# Patient Record
Sex: Male | Born: 1960 | Race: White | Hispanic: No | Marital: Married | Smoking: Never smoker
Health system: Southern US, Community
[De-identification: ages and names within clinical notes are randomized; demographics above are authoritative.]

## PROBLEM LIST (undated history)

## (undated) DIAGNOSIS — M1990 Unspecified osteoarthritis, unspecified site: Secondary | ICD-10-CM

## (undated) DIAGNOSIS — R7303 Prediabetes: Secondary | ICD-10-CM

## (undated) DIAGNOSIS — G4730 Sleep apnea, unspecified: Secondary | ICD-10-CM

## (undated) DIAGNOSIS — I10 Essential (primary) hypertension: Secondary | ICD-10-CM

## (undated) DIAGNOSIS — J45909 Unspecified asthma, uncomplicated: Secondary | ICD-10-CM

## (undated) DIAGNOSIS — T7840XA Allergy, unspecified, initial encounter: Secondary | ICD-10-CM

## (undated) DIAGNOSIS — E119 Type 2 diabetes mellitus without complications: Secondary | ICD-10-CM

## (undated) DIAGNOSIS — K219 Gastro-esophageal reflux disease without esophagitis: Secondary | ICD-10-CM

## (undated) DIAGNOSIS — I2510 Atherosclerotic heart disease of native coronary artery without angina pectoris: Secondary | ICD-10-CM

## (undated) HISTORY — DX: Sleep apnea, unspecified: G47.30

## (undated) HISTORY — DX: Allergy, unspecified, initial encounter: T78.40XA

## (undated) HISTORY — PX: CORONARY ARTERY BYPASS GRAFT: SHX141

## (undated) HISTORY — DX: Unspecified osteoarthritis, unspecified site: M19.90

## (undated) HISTORY — DX: Gastro-esophageal reflux disease without esophagitis: K21.9

## (undated) HISTORY — PX: JOINT REPLACEMENT: SHX530

## (undated) HISTORY — PX: PALATE / UVULA BIOPSY / EXCISION: SUR128

## (undated) HISTORY — PX: OTHER SURGICAL HISTORY: SHX169

## (undated) HISTORY — PX: MENISCUS REPAIR: SHX5179

## (undated) HISTORY — PX: CARDIAC SURGERY: SHX584

---

## 2012-10-05 LAB — HM COLONOSCOPY

## 2020-09-08 DIAGNOSIS — I2510 Atherosclerotic heart disease of native coronary artery without angina pectoris: Secondary | ICD-10-CM | POA: Insufficient documentation

## 2020-09-08 DIAGNOSIS — E782 Mixed hyperlipidemia: Secondary | ICD-10-CM | POA: Insufficient documentation

## 2020-09-08 DIAGNOSIS — I10 Essential (primary) hypertension: Secondary | ICD-10-CM | POA: Insufficient documentation

## 2020-09-08 DIAGNOSIS — G4733 Obstructive sleep apnea (adult) (pediatric): Secondary | ICD-10-CM | POA: Insufficient documentation

## 2020-09-08 DIAGNOSIS — Z951 Presence of aortocoronary bypass graft: Secondary | ICD-10-CM | POA: Insufficient documentation

## 2020-09-08 DIAGNOSIS — I351 Nonrheumatic aortic (valve) insufficiency: Secondary | ICD-10-CM | POA: Insufficient documentation

## 2021-06-27 HISTORY — PX: MENISCUS REPAIR: SHX5179

## 2021-11-23 ENCOUNTER — Other Ambulatory Visit: Payer: Self-pay | Admitting: Nurse Practitioner

## 2021-11-23 ENCOUNTER — Ambulatory Visit: Payer: Self-pay

## 2021-11-23 DIAGNOSIS — M25561 Pain in right knee: Secondary | ICD-10-CM

## 2021-12-15 DIAGNOSIS — S83241A Other tear of medial meniscus, current injury, right knee, initial encounter: Secondary | ICD-10-CM | POA: Insufficient documentation

## 2022-02-10 ENCOUNTER — Ambulatory Visit
Admission: EM | Admit: 2022-02-10 | Discharge: 2022-02-10 | Disposition: A | Discharge status: Home / Self Care | Payer: BC Managed Care – PPO

## 2022-02-10 DIAGNOSIS — L239 Allergic contact dermatitis, unspecified cause: Secondary | ICD-10-CM

## 2022-02-10 HISTORY — DX: Essential (primary) hypertension: I10

## 2022-02-10 HISTORY — DX: Unspecified asthma, uncomplicated: J45.909

## 2022-02-10 HISTORY — DX: Type 2 diabetes mellitus without complications: E11.9

## 2022-02-10 MED ORDER — PREDNISONE 10 MG (21) PO TBPK: ORAL_TABLET | Freq: Every day | ORAL | 0 refills | Status: DC

## 2022-02-10 MED ORDER — DEXAMETHASONE SODIUM PHOSPHATE 10 MG/ML IJ SOLN
10.0000 mg | Freq: Once | INTRAMUSCULAR | Status: AC
  Administered 2022-02-10: 10 mg via INTRAMUSCULAR

## 2022-02-10 NOTE — ED Triage Notes (Signed)
Patient to urgent care with complaints of rash x1 week over his body (not present to face or torso). Describes it as itchy. Denies use of any new products/ lotions/ detergents. Denies SHOB.  Reports the only symptom he had prior to the rash onset was a cut on his finger but this has now healed.

## 2022-02-10 NOTE — Discharge Instructions (Addendum)
You were given an injection of a steroid.  Start the prednisone taper tomorrow as directed.    Take Benadryl or Zyrtec as directed.    Follow up with your primary care provider.

## 2022-02-10 NOTE — ED Provider Notes (Addendum)
[facility]    CSN: [id number] Arrival date & time: 02/10/22  1102      History   Chief Complaint Chief Complaint  Patient presents with   Rash    HPI Geoffrey Orozco is a 61 y.o. male.   Patient presents with 2-week history of pruritic rash on his trunk and extremities.  Treatment attempted with Benadryl.  No new products, medications, foods.  He denies fever, chills, sore throat, cough, shortness of breath, or other symptoms.  His medical history includes hypertension, CAD, CABG, aortic regurgitation, hyperlipidemia, OSA, asthma, diabetes.  The history is provided by the patient and medical records.    Past Medical History:  Diagnosis Date   Asthma    Diabetes mellitus without complication (HCC)    Hypertension     There are no problems to display for this patient.   Past Surgical History:  Procedure Laterality Date   bypass     CARDIAC SURGERY     JOINT REPLACEMENT     PALATE / UVULA BIOPSY / EXCISION         Home Medications    Prior to Admission medications   Medication Sig Start Date End Date Taking? Authorizing Provider  doxazosin (CARDURA) 1 MG tablet Take 1 tablet by mouth at bedtime. 09/08/20  Yes [provider]  lisinopril (ZESTRIL) 30 MG tablet Take by mouth. 03/29/21  Yes [provider]  predniSONE (STERAPRED UNI-PAK 21 TAB) 10 MG (21) TBPK tablet Take by mouth daily. As directed 02/11/22  Yes Mickie Bail, NP  aspirin EC 81 MG tablet Take by mouth.    [provider]  Cholecalciferol 25 MCG (1000 UT) tablet Take by mouth.    [provider]  cyanocobalamin ([facility] VITAMIN B12) 1000 MCG tablet Take by mouth.    [provider]  FLUTICASONE PROPIONATE, INHAL, IN Inhale into the lungs.    [provider]  PRALUENT 75 MG/ML SOAJ Inject 1 mL into the skin every 14 (fourteen) days. 12/07/21   [provider]  Saw Palmetto, Serenoa repens, 450 MG CAPS Take by mouth.    [provider]    Family History History reviewed. No pertinent family history.  Social History Social History   Tobacco Use   Smoking status: Never   Smokeless tobacco: Never  Substance Use Topics   Alcohol use: Yes   Drug use: Never     Allergies   Nitroglycerin   Review of Systems Review of Systems  Constitutional:  Negative for chills and fever.  HENT:  Negative for ear pain and sore throat.   Respiratory:  Negative for cough and shortness of breath.   Gastrointestinal:  Negative for diarrhea and vomiting.  Skin:  Positive for rash. Negative for color change.  All other systems reviewed and are negative.    Physical Exam Triage Vital Signs ED Triage Vitals [02/10/22 1117]  Enc Vitals Group     BP 138/81     Pulse Rate 78     Resp 18     Temp 98.2 F (36.8 C)     Temp src      SpO2 97 %     Weight      Height      Head Circumference      Peak Flow      Pain Score      Pain Loc      Pain Edu?      Excl. in GC?  No data found.  Updated Vital Signs BP 138/81   Pulse 78   Temp 98.2 F (36.8 C)   Resp 18   Ht 5\' 10"  (1.778 m)   Wt 225 lb (102.1 kg)   SpO2 97%   BMI 32.28 kg/m   Visual Acuity Right Eye Distance:   Left Eye Distance:   Bilateral Distance:    Right Eye Near:   Left Eye Near:    Bilateral Near:     Physical Exam Vitals and nursing note reviewed.  Constitutional:      General: He is not in acute distress.    Appearance: Normal appearance. He is well-developed. He is not ill-appearing.  HENT:     Mouth/Throat:     Mouth: Mucous membranes are moist.  Cardiovascular:     Rate and Rhythm: Normal rate and regular rhythm.     Heart sounds: Normal heart sounds.  Pulmonary:     Effort: Pulmonary effort is normal. No respiratory distress.     Breath sounds: Normal breath sounds.  Musculoskeletal:     Cervical back: Neck supple.  Skin:    General: Skin is warm and dry.     Findings: Rash present.     Comments: Diffuse  scattered maculopapular rash on trunk and extremities.  No open wounds or drainage.  Neurological:     Mental Status: He is alert.  Psychiatric:        Mood and Affect: Mood normal.        Behavior: Behavior normal.      UC Treatments / Results  Labs (all labs ordered are listed, but only abnormal results are displayed) Labs Reviewed - No data to display  EKG   Radiology No results found.  Procedures Procedures (including critical care time)  Medications Ordered in UC Medications  dexamethasone (DECADRON) injection 10 mg (10 mg Intramuscular Given 02/10/22 1151)    Initial Impression / Assessment and Plan / UC Course  I have reviewed the triage vital signs and the nursing notes.  Pertinent labs & imaging results that were available during my care of the patient were reviewed by me and considered in my medical decision making (see chart for details).    Allergic dermatitis.  Dexamethasone injection given here 02/12/22 on Solu-Medrol).  Starting prednisone taper tomorrow.  Also discussed Zyrtec or Benadryl daily.  Education provided on contact dermatitis.  Instructed patient to follow-up with a PCP if his symptoms are not improving.  Patient does not currently have a PCP but our RN scheduled an appointment for him to establish with PCP at [facility].  Patient agrees to plan of care.  Final Clinical Impressions(s) / UC Diagnoses   Final diagnoses:  Allergic dermatitis     Discharge Instructions      You were given an injection of a steroid.  Start the prednisone taper tomorrow as directed.    Take Benadryl or Zyrtec as directed.    Follow up with your primary care provider.        ED Prescriptions     Medication Sig Dispense Auth. Provider   predniSONE (STERAPRED UNI-PAK 21 TAB) 10 MG (21) TBPK tablet Take by mouth daily. As directed 21 tablet [facility], NP      PDMP not reviewed this encounter.   Mickie Bail, NP 02/10/22  1156    02/12/22, NP 02/10/22 1159

## 2022-03-24 ENCOUNTER — Ambulatory Visit (INDEPENDENT_AMBULATORY_CARE_PROVIDER_SITE_OTHER): Payer: BC Managed Care – PPO | Admitting: Nurse Practitioner

## 2022-03-24 ENCOUNTER — Encounter: Payer: Self-pay | Admitting: Nurse Practitioner

## 2022-03-24 VITALS — BP 136/82 | HR 76 | Temp 97.1°F | Resp 10 | Ht 70.0 in | Wt 234.1 lb

## 2022-03-24 DIAGNOSIS — E6609 Other obesity due to excess calories: Secondary | ICD-10-CM | POA: Insufficient documentation

## 2022-03-24 DIAGNOSIS — Z951 Presence of aortocoronary bypass graft: Secondary | ICD-10-CM

## 2022-03-24 DIAGNOSIS — Z6833 Body mass index (BMI) 33.0-33.9, adult: Secondary | ICD-10-CM

## 2022-03-24 DIAGNOSIS — G4733 Obstructive sleep apnea (adult) (pediatric): Secondary | ICD-10-CM | POA: Diagnosis not present

## 2022-03-24 DIAGNOSIS — I10 Essential (primary) hypertension: Secondary | ICD-10-CM | POA: Diagnosis not present

## 2022-03-24 DIAGNOSIS — R140 Abdominal distension (gaseous): Secondary | ICD-10-CM | POA: Insufficient documentation

## 2022-03-24 DIAGNOSIS — Z1211 Encounter for screening for malignant neoplasm of colon: Secondary | ICD-10-CM

## 2022-03-24 DIAGNOSIS — I2510 Atherosclerotic heart disease of native coronary artery without angina pectoris: Secondary | ICD-10-CM

## 2022-03-24 DIAGNOSIS — E782 Mixed hyperlipidemia: Secondary | ICD-10-CM

## 2022-03-24 LAB — CBC
HCT: 41.6 % (ref 39.0–52.0)
Hemoglobin: 14.1 g/dL (ref 13.0–17.0)
MCHC: 34 g/dL (ref 30.0–36.0)
MCV: 88.3 fl (ref 78.0–100.0)
Platelets: 257 10*3/uL (ref 150.0–400.0)
RBC: 4.71 Mil/uL (ref 4.22–5.81)
RDW: 14.2 % (ref 11.5–15.5)
WBC: 5.3 10*3/uL (ref 4.0–10.5)

## 2022-03-24 LAB — COMPREHENSIVE METABOLIC PANEL
ALT: 50 U/L (ref 0–53)
AST: 22 U/L (ref 0–37)
Albumin: 4.7 g/dL (ref 3.5–5.2)
Alkaline Phosphatase: 58 U/L (ref 39–117)
BUN: 21 mg/dL (ref 6–23)
CO2: 28 mEq/L (ref 19–32)
Calcium: 10 mg/dL (ref 8.4–10.5)
Chloride: 105 mEq/L (ref 96–112)
Creatinine, Ser: 0.98 mg/dL (ref 0.40–1.50)
GFR: 82.76 mL/min (ref >60.00)
Glucose, Bld: 95 mg/dL (ref 70–99)
Potassium: 4.2 mEq/L (ref 3.5–5.1)
Sodium: 139 mEq/L (ref 135–145)
Total Bilirubin: 0.5 mg/dL (ref 0.2–1.2)
Total Protein: 7.3 g/dL (ref 6.0–8.3)

## 2022-03-24 LAB — HEMOGLOBIN A1C: Hgb A1c MFr Bld: 5.9 % (ref 4.6–6.5)

## 2022-03-24 LAB — H. PYLORI ANTIBODY, IGG: H Pylori IgG: NEGATIVE

## 2022-03-24 LAB — LIPASE: Lipase: 53 U/L (ref 11.0–59.0)

## 2022-03-24 MED ORDER — OMEPRAZOLE 20 MG PO CPDR: 20.0000 mg | DELAYED_RELEASE_CAPSULE | Freq: Every day | ORAL | 2 refills | Status: DC

## 2022-03-24 NOTE — Assessment & Plan Note (Signed)
History of CABG and then 2 stents post CABG per patient report.

## 2022-03-24 NOTE — Assessment & Plan Note (Signed)
Currently followed by Dr. Jac Canavan on doxazosin and lisinopril.

## 2022-03-24 NOTE — Assessment & Plan Note (Signed)
Check labs inclusive of TSH and A1c.

## 2022-03-24 NOTE — Assessment & Plan Note (Signed)
We will start patient on omeprazole 20 mg daily.  We will do for 1 to 3 months depending on response.  If no response consider probiotic.  Pending labs inclusive of lipase and H. pylori

## 2022-03-24 NOTE — Progress Notes (Signed)
New Patient Office Visit  Subjective    Patient ID: Geoffrey Orozco, male    DOB: 1960-09-10  Age: 61 y.o. MRN: [id number]  CC:  Chief Complaint  Patient presents with   Establish Care    Previous PCP in [place] about 5 years ago Dr. Langley Gauss L. Autotte Kays   abdominal bloating/distention    Noticed about 2 months ago his stomach staying hard and bloated.    HPI Geoffrey Orozco presents to establish care   HTN: Dr Jac Canavan  manages all the meds. Checks it montly at home.  Currently maintained on doxazosin lisinopril.  HLD: Repatha for HLD. States that it has been on it for years. Has trailed several statins with all having adverse effects  OSA: Had surgery and no longer has troube. No CPAP  Abdomen: states that he stopped working out because of his knee. States that since then he has gained weight and feels bloated even with a glass of water. Early satietiy. Bm daily at least twice a day, no rock hard stools or pellets. Last colonoscopy 12 years ago. Has tried tums when it is bad and helps some.  TD 2018 Flu vaccine yesterday, [facility] Moderna: Shingles up-to-date per patient report [facility]    Outpatient Encounter Medications as of 03/24/2022  Medication Sig   aspirin EC 81 MG tablet Take by mouth.   Cholecalciferol 25 MCG (1000 UT) tablet Take by mouth.   cyanocobalamin ([facility] VITAMIN B12) 1000 MCG tablet Take by mouth.   doxazosin (CARDURA) 1 MG tablet Take 1 tablet by mouth at bedtime.   lisinopril (ZESTRIL) 30 MG tablet Take by mouth.   omeprazole (PRILOSEC) 20 MG capsule Take 1 capsule (20 mg total) by mouth daily.   REPATHA SURECLICK [id number] MG/ML SOAJ Inject 1 mL into the skin every 14 (fourteen) days.   Saw Palmetto, Serenoa repens, 450 MG CAPS Take by mouth.   [DISCONTINUED] FLUTICASONE PROPIONATE, INHAL, IN Inhale into the lungs.   [DISCONTINUED] PRALUENT 75 MG/ML SOAJ Inject 1 mL into the skin every 14 (fourteen) days.   [DISCONTINUED]  predniSONE (STERAPRED UNI-PAK 21 TAB) 10 MG (21) TBPK tablet Take by mouth daily. As directed   No facility-administered encounter medications on file as of 03/24/2022.    Past Medical History:  Diagnosis Date   Allergy 1971   Seasonal allergies. 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   Asthma    Diabetes mellitus without complication ([place])    Hypertension    Sleep apnea 1998    Past Surgical History:  Procedure Laterality Date   CORONARY ARTERY BYPASS GRAFT  2009   Quadruple bypass   MENISCUS REPAIR Right    got hurt on the job   PALATE / UVULA BIOPSY / EXCISION      Family History  Problem Relation Age of Onset   Arthritis Mother    Glaucoma Mother    Heart disease Father        pacemaker   Hearing loss Father    Glaucoma Maternal Grandmother    Heart attack Maternal Grandfather 85    Social History   Socioeconomic History   Marital status: Married    Spouse name: Mardene Celeste   Number of children: 3   Years of education: Some   Highest education level: Not on file  Occupational History   Not on file  Tobacco Use   Smoking status: Never   Smokeless tobacco: Never  Vaping  Use   Vaping Use: Never used  Substance and Sexual Activity   Alcohol use: Yes    Comment: Social   Drug use: Never   Sexual activity: Yes    Birth control/protection: None  Other Topics Concern   Not on file  Social History Narrative   Fulltime: Mr [employer]   Out since 11/19/2021      29 Danella Maiers 27, tyler 26   Social Determinants of Health   Financial Resource Strain: Not on [employer] Insecurity: Not on file  Transportation Needs: Not on file  Physical Activity: Not on file  Stress: Not on file  Social Connections: Not on file  Intimate Partner Violence: Not on file    Review of Systems  Constitutional:  Negative for chills and fever.  Respiratory:  Positive for shortness of breath (when bending over).   Cardiovascular:  Negative for chest pain.   Gastrointestinal:  Negative for abdominal pain, diarrhea, nausea and vomiting.        Objective    BP 136/82   Pulse 76   Temp (!) 97.1 F (36.2 C) (Temporal)   Resp 10   Ht 5\' 10"  (1.778 m)   Wt 234 lb 2 oz (106.2 kg)   SpO2 96%   BMI 33.59 kg/m   Physical Exam Vitals and nursing note reviewed.  Constitutional:      Appearance: Normal appearance. He is obese.  Cardiovascular:     Rate and Rhythm: Normal rate and regular rhythm.     Heart sounds: Normal heart sounds.  Pulmonary:     Effort: Pulmonary effort is normal.     Breath sounds: Normal breath sounds.  Abdominal:     General: Bowel sounds are normal. There is no distension.     Palpations: There is no mass.     Tenderness: There is no abdominal tenderness.     Hernia: No hernia is present.  Musculoskeletal:     Right lower leg: Edema (trace) present.     Left lower leg: Edema (trace) present.  Neurological:     Mental Status: He is alert.         Assessment & Plan:   Problem List Items Addressed This Visit       Cardiovascular and Mediastinum   Coronary artery disease involving native coronary artery of native heart without angina pectoris    Patient currently followed by Dr. Jac Canavan cardiology on [place].      Relevant Medications   REPATHA SURECLICK 027 MG/ML SOAJ   HTN (hypertension)    Currently followed by Dr. Jac Canavan on doxazosin and lisinopril.      Relevant Medications   REPATHA SURECLICK 253 MG/ML SOAJ   Other Relevant Orders   TSH     Respiratory   OSA (obstructive sleep apnea)    Patient underwent uvulectomy.  Not currently maintained on CPAP.        Other   Mixed hyperlipidemia    Followed by cardiology currently on Repatha.      Relevant Medications   REPATHA SURECLICK 664 MG/ML SOAJ   S/P CABG x 4    History of CABG and then 2 stents post CABG per patient report.      Abdominal bloating - Primary    We will start patient on omeprazole 20 mg daily.  We will do for  1 to 3 months depending on response.  If no response consider probiotic.  Pending labs inclusive of lipase and H. pylori  Relevant Medications   omeprazole (PRILOSEC) 20 MG capsule   Other Relevant Orders   CBC   Comprehensive metabolic panel   Lipase   H. pylori antibody, IgG   Class 1 obesity due to excess calories with serious comorbidity and body mass index (BMI) of 33.0 to 33.9 in adult    Check labs inclusive of TSH and A1c.      Relevant Orders   TSH   Hemoglobin A1c   Other Visit Diagnoses     Screening for colon cancer       Relevant Orders   Ambulatory referral to Gastroenterology       Return in about 6 months (around 09/22/2022) for CPE and Labs.   Romilda Garret, NP

## 2022-03-24 NOTE — Assessment & Plan Note (Signed)
Followed by cardiology currently on [place].

## 2022-03-24 NOTE — Assessment & Plan Note (Signed)
Patient underwent uvulectomy.  Not currently maintained on CPAP.

## 2022-03-24 NOTE — Patient Instructions (Signed)
Nice to see you today I will be in touch with the labs once I have them I sent the omeprazole to the pharmacy Follow up with me in aorund 6 months for your physical, sooner if you need me

## 2022-03-24 NOTE — Assessment & Plan Note (Signed)
Patient currently followed by Dr. Jac Canavan cardiology on [place].

## 2022-03-25 LAB — TSH: TSH: 1.7 u[IU]/mL (ref 0.35–5.50)

## 2022-05-13 ENCOUNTER — Telehealth (INDEPENDENT_AMBULATORY_CARE_PROVIDER_SITE_OTHER): Payer: BC Managed Care – PPO | Admitting: Nurse Practitioner

## 2022-05-13 ENCOUNTER — Encounter: Payer: Self-pay | Admitting: Nurse Practitioner

## 2022-05-13 VITALS — BP 140/76

## 2022-05-13 DIAGNOSIS — U071 COVID-19: Secondary | ICD-10-CM | POA: Diagnosis not present

## 2022-05-13 MED ORDER — NIRMATRELVIR/RITONAVIR (PAXLOVID)TABLET: 3.0000 | ORAL_TABLET | Freq: Two times a day (BID) | ORAL | 0 refills | Status: AC

## 2022-05-13 NOTE — Assessment & Plan Note (Signed)
Tested positive for COVID-19.  Did discuss antivirals and they are both EUA only.  After joint discussion elected to pursue Paxlovid antiviral.  Common side effects of medication discussed.  Signs and symptoms reviewed when to seek urgent emergent healthcare.  Did discuss CDC guidelines/recommendations in regards to self-isolation/quarantine.  Follow-up if no improvement

## 2022-05-13 NOTE — Progress Notes (Signed)
Patient ID: Geoffrey Orozco, male    DOB: 1961-01-23, 61 y.o.   MRN: [id number]  Virtual visit completed through [employer], a video enabled telemedicine application. Due to national recommendations of social distancing due to COVID-19, a virtual visit is felt to be most appropriate for this patient at this time. Reviewed limitations, risks, security and privacy concerns of performing a virtual visit and the availability of in person appointments. I also reviewed that there may be a patient responsible charge related to this service. The patient agreed to proceed.   Patient location: home Provider location: [place] [facility], office Persons participating in this virtual visit: patient, provider   If any vitals were documented, they were collected by patient at home unless specified below.    BP (!) 140/76    CC: Covid 19 Subjective:   HPI: Geoffrey Orozco is a 61 y.o. male presenting on 05/13/2022 for Covid Positive (On 05/13/22, sx started 05/12/22-fatigue, stuffy nose, decreased taste, cough. No fever.)    Symptoms started on 05/12/2022 COvid test on 05/13/2022 that was positive Moderna x2 vaccine Spouse has covid that started Sunday evening  Has been dyquill and nyquill and helped with sleep     Relevant past medical, surgical, family and social history reviewed and updated as indicated. Interim medical history since our last visit reviewed. Allergies and medications reviewed and updated. Outpatient Medications Prior to Visit  Medication Sig Dispense Refill   aspirin EC 81 MG tablet Take by mouth.     Cholecalciferol 25 MCG (1000 UT) tablet Take by mouth.     cyanocobalamin ([facility] VITAMIN B12) 1000 MCG tablet Take by mouth.     doxazosin (CARDURA) 1 MG tablet Take 1 tablet by mouth at bedtime.     lisinopril (ZESTRIL) 30 MG tablet Take by mouth.     omeprazole (PRILOSEC) 20 MG capsule Take 1 capsule (20 mg total) by mouth daily. 30 capsule 2   REPATHA SURECLICK  140 MG/ML SOAJ Inject 1 mL into the skin every 14 (fourteen) days.     Saw Palmetto, Serenoa repens, 450 MG CAPS Take by mouth.     No facility-administered medications prior to visit.     Per HPI unless specifically indicated in ROS section below Review of Systems  Constitutional:  Positive for appetite change, chills and fatigue. Negative for fever.       Taste gone  Drinking water   HENT:  Positive for sinus pain. Negative for ear discharge, ear pain and sore throat.   Respiratory:  Positive for cough and shortness of breath (doe).   Cardiovascular:  Negative for chest pain.  Gastrointestinal:  Negative for abdominal pain, anal bleeding, diarrhea and nausea.  Musculoskeletal:  Positive for arthralgias and myalgias.  Neurological:  Positive for headaches.   Objective:  BP (!) 140/76   Wt Readings from Last 3 Encounters:  03/24/22 234 lb 2 oz (106.2 kg)  02/10/22 225 lb (102.1 kg)       Physical exam: Gen: alert, NAD, not ill appearing Pulm: speaks in complete sentences without increased work of breathing Psych: normal mood, normal thought content      Results for orders placed or performed in visit on 05/06/22  HM COLONOSCOPY  Result Value Ref Range   HM Colonoscopy See Report (in chart) See Report (in chart), Patient Reported   Assessment & Plan:   Problem List Items Addressed This Visit       Other   COVID-19 - Primary  Tested positive for COVID-19.  Did discuss antivirals and they are both EUA only.  After joint discussion elected to pursue Paxlovid antiviral.  Common side effects of medication discussed.  Signs and symptoms reviewed when to seek urgent emergent healthcare.  Did discuss CDC guidelines/recommendations in regards to self-isolation/quarantine.  Follow-up if no improvement      Relevant Medications   nirmatrelvir/ritonavir EUA (PAXLOVID) 20 x 150 MG & 10 x 100MG  TABS     Meds ordered this encounter  Medications   nirmatrelvir/ritonavir EUA  (PAXLOVID) 20 x 150 MG & 10 x 100MG  TABS    Sig: Take 3 tablets by mouth 2 (two) times daily for 5 days. (Take nirmatrelvir 150 mg two tablets twice daily for 5 days and ritonavir 100 mg one tablet twice daily for 5 days) Patient GFR is 82    Dispense:  30 tablet    Refill:  0    Order Specific Question:   Supervising Provider    Answer:   TOWER, MARNE A [[id number]]   No orders of the defined types were placed in this encounter.   I discussed the assessment and treatment plan with the patient. The patient was provided an opportunity to ask questions and all were answered. The patient agreed with the plan and demonstrated an understanding of the instructions. The patient was advised to call back or seek an in-person evaluation if the symptoms worsen or if the condition fails to improve as anticipated.  Follow up plan: Return if symptoms worsen or fail to improve.  Geoffrey Garret, NP

## 2022-08-10 ENCOUNTER — Ambulatory Visit (INDEPENDENT_AMBULATORY_CARE_PROVIDER_SITE_OTHER)
Admission: RE | Admit: 2022-08-10 | Discharge: 2022-08-10 | Disposition: A | Discharge status: Home / Self Care | Payer: BC Managed Care – PPO | Source: Ambulatory Visit | Attending: Nurse Practitioner | Admitting: Nurse Practitioner

## 2022-08-10 ENCOUNTER — Encounter: Payer: Self-pay | Admitting: Nurse Practitioner

## 2022-08-10 ENCOUNTER — Ambulatory Visit (INDEPENDENT_AMBULATORY_CARE_PROVIDER_SITE_OTHER): Payer: BC Managed Care – PPO | Admitting: Nurse Practitioner

## 2022-08-10 VITALS — BP 136/82 | HR 80 | Temp 97.8°F | Ht 70.0 in | Wt 229.6 lb

## 2022-08-10 DIAGNOSIS — R0602 Shortness of breath: Secondary | ICD-10-CM

## 2022-08-10 DIAGNOSIS — R140 Abdominal distension (gaseous): Secondary | ICD-10-CM

## 2022-08-10 DIAGNOSIS — F411 Generalized anxiety disorder: Secondary | ICD-10-CM | POA: Diagnosis not present

## 2022-08-10 DIAGNOSIS — M25561 Pain in right knee: Secondary | ICD-10-CM | POA: Insufficient documentation

## 2022-08-10 NOTE — Assessment & Plan Note (Signed)
Ambiguous in nature.  Will do chest x-ray.  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  This does not get worse with exertion.  Did discuss patient using antihistamine and nasal sprays he would like to decline at this juncture as he states that he become dependent on those and he also has sensitivity to medications.

## 2022-08-10 NOTE — Patient Instructions (Signed)
Try Align probiotic over the counter If you are not improving in 2 weeks I need to know I have referred you to therapy. If you have not heard from them in 2 weeks let me know

## 2022-08-10 NOTE — Assessment & Plan Note (Signed)
Has been following with orthopedist from a meniscal tear of the right knee.  States he is having pain more medially and superiorly.  Did discuss this with his orthopedist he has been using blue emu cream without great relief.  Told patient to follow-up with orthopedist as he is already established

## 2022-08-10 NOTE — Assessment & Plan Note (Signed)
Did discuss different treatment options inclusive of buspirone and SSRIs.  Did discuss side effect profile.  Patient is fairly sensitive to medications and would like to try nonpharmacological methods to begin with.  Will send patient for therapy ambulatory referral to psychology made today

## 2022-08-10 NOTE — Progress Notes (Signed)
Acute Office Visit  Subjective:     Patient ID: Geoffrey Orozco, male    DOB: [birth year], 62 y.o.   MRN: [id number]  Chief Complaint  Patient presents with   Abdominal Pain    Diarrhea, Bloating and stomach issues since 9/23   Anxiety   Breathing Problem     Patient is in today for multiple complaints  Abdominal pain: was seen by me on 03/24/2022. Labs were drawn and he was referred to GI. He deferred the colonoscopy due to not having transportation. He was placed on omeprazole. States that he did omeprazole for 3 weeks caused diarrhea and then stopped. States that he is feeling bloated even after water. States that he will feel bloated and no pain.  States that he has tried tums before with some releif. 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 States that he woke up that morning and can't breath and felt anxious. States that he could not lay down and he was pacing around. States that he sat in the recliner and then went back to bed. He raised thehead of the bed and was able to fall back to sleep. States that it does happen other times. States never had to do anything for anxiety in the past. States that he can tell it coming with a weight on his chest and then he concentrates on the breathing sometimes it works sometimes it does not. States that it will last 15-30 mins for the attack   Breathing: has been going on for months. He does hav a cardiac history but feels different. Has been seen by cardiology in [place]. States it feels constricted that he cannot get a good breath. It is at rest. He has been walkiing for approx 57mns several times a week and the first 100 yeards he feels it after that he feels ok. He has also been lifiting weights     08/10/2022    3:05 PM 03/24/2022   12:20 PM  GAD 7 : Generalized Anxiety Score  Nervous, Anxious, on Edge 2 1  Control/stop worrying 1 1  Worry too much - different things 2 1  Trouble  relaxing 1 0  Restless 1 1  Easily annoyed or irritable 0 0  Afraid - awful might happen 0 0  Total GAD 7 Score 7 4  Anxiety Difficulty Not difficult at all Not difficult at all        08/10/2022    3:05 PM 03/24/2022   12:20 PM  PHQ9 SCORE ONLY  PHQ-9 Total Score 0 0    Review of Systems  Respiratory:  Positive for shortness of breath.   Cardiovascular:  Positive for chest pain (chest heaviness).  Gastrointestinal:  Positive for abdominal pain.  Psychiatric/Behavioral:  Negative for hallucinations and suicidal ideas. The patient is nervous/anxious. The patient does not have insomnia.         Objective:    BP 136/82   Pulse 80   Temp 97.8 F (36.6 C) (Oral)   Ht 5' 10"$  (1.778 m)   Wt 229 lb 9.6 oz (104.1 kg)   SpO2 99%   BMI 32.94 kg/m  BP Readings from Last 3 Encounters:  08/10/22 136/82  05/13/22 (!) 140/76  03/24/22 136/82   Wt Readings from Last 3 Encounters:  08/10/22 229 lb 9.6 oz (104.1 kg)  03/24/22 234 lb 2 oz (106.2 kg)  02/10/22 225 lb (102.1 kg)  Physical Exam Vitals and nursing note reviewed.  Constitutional:      Appearance: Normal appearance.  Cardiovascular:     Rate and Rhythm: Normal rate and regular rhythm.     Heart sounds: Normal heart sounds.  Pulmonary:     Effort: Pulmonary effort is normal.     Breath sounds: Normal breath sounds.  Abdominal:     General: Bowel sounds are normal. There is no distension.     Palpations: There is no mass.     Tenderness: There is no abdominal tenderness.     Hernia: No hernia is present.  Neurological:     Mental Status: He is alert.     No results found for any visits on 08/10/22.      Assessment & Plan:   Problem List Items Addressed This Visit       Other   Abdominal bloating    We did try patient on a PPI but caused diarrhea did resolve once stopping the PPI medication.  Patient is still having bloating and early satiety.  No abdominal pain no consistent diarrhea.  Patient will  try probiotic if he does not improve in the next 2 weeks he will reach out to me and we will go back to GI for further evaluation.  Does not meet criteria right now for a CT scan of abdomen pelvis      GAD (generalized anxiety disorder)    Did discuss different treatment options inclusive of buspirone and SSRIs.  Did discuss side effect profile.  Patient is fairly sensitive to medications and would like to try nonpharmacological methods to begin with.  Will send patient for therapy ambulatory referral to psychology made today      Relevant Orders   Ambulatory referral to Psychology   Acute pain of right knee    Has been following with orthopedist from a meniscal tear of the right knee.  States he is having pain more medially and superiorly.  Did discuss this with his orthopedist he has been using blue emu cream without great relief.  Told patient to follow-up with orthopedist as he is already established      Shortness of breath - Primary    Ambiguous in nature.  Will do chest x-ray.  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  This does not get worse with exertion.  Did discuss patient using antihistamine and nasal sprays he would like to decline at this juncture as he states that he become dependent on those and he also has sensitivity to medications.      Relevant Orders   DG Chest 2 View    No orders of the defined types were placed in this encounter.   Return if symptoms worsen or fail to improve.  Romilda Garret, NP

## 2022-08-10 NOTE — Assessment & Plan Note (Signed)
We did try patient on a PPI but caused diarrhea did resolve once stopping the PPI medication.  Patient is still having bloating and early satiety.  No abdominal pain no consistent diarrhea.  Patient will try probiotic if he does not improve in the next 2 weeks he will reach out to me and we will go back to GI for further evaluation.  Does not meet criteria right now for a CT scan of abdomen pelvis

## 2022-08-25 ENCOUNTER — Telehealth: Payer: Self-pay | Admitting: Nurse Practitioner

## 2022-08-25 DIAGNOSIS — R6881 Early satiety: Secondary | ICD-10-CM

## 2022-08-25 DIAGNOSIS — R140 Abdominal distension (gaseous): Secondary | ICD-10-CM

## 2022-08-25 NOTE — Telephone Encounter (Signed)
Pt informed of this information.

## 2022-08-25 NOTE — Telephone Encounter (Signed)
Pt stated during ov with [facility] on 2/14, he was instructed to let Charmian Muff know in a few weeks if the probiotics helped with his stomach issue or not. Pt states the probiotics aren't helping. Call back # [phone]

## 2022-08-25 NOTE — Addendum Note (Signed)
Addended by: Michela Pitcher on: 08/25/2022 01:44 PM   Modules accepted: Orders

## 2022-08-25 NOTE — Telephone Encounter (Signed)
Thanks for the up date. He will need to see a GI doctor. I have placed the referral for [facility]

## 2022-08-28 ENCOUNTER — Encounter: Payer: Self-pay | Admitting: Emergency Medicine

## 2022-08-28 ENCOUNTER — Ambulatory Visit
Admission: EM | Admit: 2022-08-28 | Discharge: 2022-08-28 | Disposition: A | Discharge status: Home / Self Care | Payer: BC Managed Care – PPO | Attending: Urgent Care | Admitting: Urgent Care

## 2022-08-28 DIAGNOSIS — T7840XA Allergy, unspecified, initial encounter: Secondary | ICD-10-CM

## 2022-08-28 MED ORDER — PREDNISONE 10 MG PO TABS: ORAL_TABLET | ORAL | 0 refills | Status: DC

## 2022-08-28 MED ORDER — METHYLPREDNISOLONE SODIUM SUCC 125 MG IJ SOLR
60.0000 mg | Freq: Once | INTRAMUSCULAR | Status: AC
  Administered 2022-08-28: 60 mg via INTRAMUSCULAR

## 2022-08-28 NOTE — ED Provider Notes (Signed)
[facility]    CSN: [id number] Arrival date & time: 08/28/22  1027      History   Chief Complaint Chief Complaint  Patient presents with   Rash    HPI Geoffrey Orozco is a 62 y.o. male.    Rash   Patient presents to urgent care with complaint of rash all over his body x 1 week.  He states the rash started after taking medication for pinkeye.  Trying Benadryl and cortisone cream.  [facility] and prescribed erythromycin ointment for conjunctivitis.  Past Medical History:  Diagnosis Date   Allergy 1971   Seasonal allergies. 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   Asthma    Diabetes mellitus without complication ([place])    Hypertension    Sleep apnea 1998    Patient Active Problem List   Diagnosis Date Noted   GAD (generalized anxiety disorder) 08/10/2022   Acute pain of right knee 08/10/2022   Shortness of breath 08/10/2022   COVID-19 05/13/2022   Abdominal bloating 03/24/2022   Class 1 obesity due to excess calories with serious comorbidity and body mass index (BMI) of 33.0 to 33.9 in adult 03/24/2022   Acute medial meniscus tear of right knee 12/15/2021   Coronary artery disease involving native coronary artery of native heart without angina pectoris 09/08/2020   HTN (hypertension) 09/08/2020   Mild aortic regurgitation 09/08/2020   Mixed hyperlipidemia 09/08/2020   OSA (obstructive sleep apnea) 09/08/2020   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 Medications    Prior to Admission medications   Medication Sig Start Date End Date Taking? Authorizing Provider  aspirin EC 81 MG tablet Take by mouth.    [provider]  Beta Carotene (VITAMIN A) 25000 UNIT capsule Take 25,000 Units by mouth daily.    [provider]  Bioflavonoid Products (BIOFLEX PO) Take by mouth.    [provider]  Cholecalciferol 25 MCG (1000 UT) tablet Take by mouth.    [provider]  cyanocobalamin ([employer] VITAMIN B12) 1000 MCG tablet Take by mouth.    [provider]  doxazosin (CARDURA) 1 MG tablet Take 1 tablet by mouth at bedtime. 09/08/20   [provider]  lisinopril (ZESTRIL) 30 MG tablet Take by mouth. 03/29/21   [provider]  REPATHA SURECLICK XX123456 MG/ML SOAJ Inject 1 mL into the skin every 14 (fourteen) days. 03/04/22   [provider]  Saw Palmetto, Serenoa repens, 450 MG CAPS Take by mouth.    [provider]  vitamin k 100 MCG tablet Take 100 mcg by mouth daily.    [provider]    Family History Family History  Problem Relation Age of Onset   Arthritis Mother    Glaucoma Mother    Heart disease Father        pacemaker   Hearing loss Father    Glaucoma Maternal Grandmother    Heart attack Maternal Grandfather 85    Social History Social History   Tobacco Use   Smoking status: Never   Smokeless tobacco: Never  Vaping Use   Vaping Use: Never used  Substance Use Topics  Alcohol use: Yes    Comment: Social   Drug use: Never     Allergies   Nitroglycerin   Review of Systems Review of Systems  Skin:  Positive for rash.     Physical Exam Triage Vital Signs ED Triage Vitals  Enc Vitals Group     BP 08/28/22 1222 (!) 158/85     Pulse Rate 08/28/22 1222 68     Resp 08/28/22 1222 16     Temp 08/28/22 1222 98.1 F (36.7 C)     Temp Source 08/28/22 1222 Oral     SpO2 08/28/22 1222 98 %     Weight --      Height --      Head Circumference --      Peak Flow --      Pain Score 08/28/22 1221 0     Pain Loc --      Pain Edu? --      Excl. in [place]? --    No data found.  Updated Vital Signs BP (!) 158/85 (BP Location: Right Arm)   Pulse 68   Temp 98.1 F (36.7 C) (Oral)   Resp 16   SpO2 98%   Visual  Acuity Right Eye Distance:   Left Eye Distance:   Bilateral Distance:    Right Eye Near:   Left Eye Near:    Bilateral Near:     Physical Exam Vitals reviewed.  Constitutional:      Appearance: Normal appearance.  Skin:    General: Skin is warm and dry.     Findings: Erythema and rash present.  Neurological:     General: No focal deficit present.     Mental Status: He is alert and oriented to person, place, and time.  Psychiatric:        Mood and Affect: Mood normal.        Behavior: Behavior normal.      UC Treatments / Results  Labs (all labs ordered are listed, but only abnormal results are displayed) Labs Reviewed - No data to display  EKG   Radiology No results found.  Procedures Procedures (including critical care time)  Medications Ordered in UC Medications - No data to display  Initial Impression / Assessment and Plan / UC Course  I have reviewed the triage vital signs and the nursing notes.  Pertinent labs & imaging results that were available during my care of the patient were reviewed by me and considered in my medical decision making (see chart for details).   Erythematous and pruritic rash on the patient's chest, back, arms, legs.  Not responsive to Benadryl, temporarily responsive to p.o. cortisone, so will prescribe a course of systemic corticosteroids.  Warned patient to not stop the medication immediately after symptoms resolve to prevent relapse.   Final Clinical Impressions(s) / UC Diagnoses   Final diagnoses:  None   Discharge Instructions   None    ED Prescriptions   None    PDMP not reviewed this encounter.   Rose Phi, [place] 08/28/22 1232

## 2022-08-28 NOTE — Discharge Instructions (Signed)
Follow up here or with your primary care provider if your symptoms are worsening or not improving with treatment.          

## 2022-08-28 NOTE — ED Triage Notes (Signed)
Pt presents with a rash all over his body x 1 week. Pt states the rash started after taking medication for pink eye. He has tried benadryl and cortisone cream.

## 2022-09-06 ENCOUNTER — Other Ambulatory Visit: Payer: Self-pay

## 2022-09-06 ENCOUNTER — Encounter: Payer: Self-pay | Admitting: Gastroenterology

## 2022-09-06 ENCOUNTER — Ambulatory Visit (INDEPENDENT_AMBULATORY_CARE_PROVIDER_SITE_OTHER): Payer: BC Managed Care – PPO | Admitting: Gastroenterology

## 2022-09-06 ENCOUNTER — Ambulatory Visit (INDEPENDENT_AMBULATORY_CARE_PROVIDER_SITE_OTHER): Payer: BC Managed Care – PPO | Admitting: Clinical

## 2022-09-06 VITALS — BP 169/90 | HR 70 | Temp 97.8°F | Ht 70.0 in | Wt 227.1 lb

## 2022-09-06 DIAGNOSIS — F40298 Other specified phobia: Secondary | ICD-10-CM | POA: Diagnosis not present

## 2022-09-06 DIAGNOSIS — R140 Abdominal distension (gaseous): Secondary | ICD-10-CM

## 2022-09-06 DIAGNOSIS — K529 Noninfective gastroenteritis and colitis, unspecified: Secondary | ICD-10-CM | POA: Diagnosis not present

## 2022-09-06 DIAGNOSIS — F411 Generalized anxiety disorder: Secondary | ICD-10-CM | POA: Diagnosis not present

## 2022-09-06 NOTE — Progress Notes (Signed)
                Camber Ninh, LCSW 

## 2022-09-06 NOTE — Progress Notes (Signed)
[facility] Counselor Initial Adult Exam  Name: Geoffrey Orozco Date: 09/06/2022 MRN: [id number] DOB: 16-Aug-1960 PCP: Michela Pitcher, NP  Time spent: 8:33am - 9:12am   Guardian/Payee:  NA    Paperwork requested:  NA  Reason for Visit /Presenting Problem: Patient reported "anxieties" in response to reason for today's visit.  Patient reported it bothers him when he feels he is not in control. Patient reported he experiences racing thoughts at night, difficulty breathing, and chest pressure.   Mental Status Exam: Appearance:   Well Groomed     Behavior:  Appropriate  Motor:  Normal  Speech/Language:   Clear and Coherent  Affect:  Appropriate  Mood:  normal  Thought process:  normal  Thought content:    WNL  Sensory/Perceptual disturbances:    WNL  Orientation:  oriented to person, place, situation, and day of week  Attention:  Good  Concentration:  Good  Memory:  WNL  Fund of knowledge:   Good  Insight:    Good  Judgment:   Good  Impulse Control:  Good   Reported Symptoms:  Patient reported difficulty falling asleep and staying asleep, stated "I can't shut it off" in response to thoughts, racing thoughts at night, difficulty breathing, chest pressure, decreased concentration, worry, takes on others' problems, restlessness, irritability. Patient stated, "I've always had it to a certain extent" in regards to symptoms of anxiety but reported symptoms increased with his last job. Patient reported "easy going" mood during the day and increased anxiety at night. Patient reported experiencing claustrophobia in "tight situations" and confined spaces. Patient reported when in confined spaces he has experienced cold sweats, chest pressure, shortness of breath, and reported he avoids confined spaces.   Risk Assessment: Danger to Self:  No Patient denied current and past suicidal ideation and symptoms of psychosis Self-injurious Behavior: No Danger to Others: No Patient denied  current and past homicidal ideation  Duty to Warn:no Physical Aggression / Violence:No  Access to Firearms a concern: No  Gang Involvement:No  Patient / guardian was educated about steps to take if suicide or homicide risk level increases between visits: yes While future psychiatric events cannot be accurately predicted, the patient does not currently require acute inpatient psychiatric care and does not currently meet [facility] involuntary commitment criteria.  Substance Abuse History: Current substance abuse: Yes  patient reported he drinks socially, approximately 1-2 times per month, with last use 2 week ago. Patient reported no current or past tobacco use. Patient reported no current drug use. Patient reported history of marijuana use over 35 years ago.   Past Psychiatric History:   No previous psychological problems have been observed Outpatient Providers: none History of Psych Hospitalization: No  Psychological Testing:  none    Abuse History:  Victim of: No.,  none    Report needed: No. Victim of Neglect:No. Perpetrator of  none   Witness / Exposure to Domestic Violence: No   Protective Services Involvement: No  Witness to [employer] Violence:  No   Family History:  Family History  Problem Relation Age of Onset   Arthritis Mother    Glaucoma Mother    Heart disease Father        pacemaker   Hearing loss Father    Glaucoma Maternal Grandmother    Heart attack Maternal Grandfather 85    Living situation: the patient lives with their spouse  Sexual Orientation: Straight  Relationship Status: married  Name of spouse / other: Geoffrey Orozco If a  parent, number of children / ages: 3 children (ages 62, 52, 66)  Support Systems: spouse friends  Museum/gallery curator Stress:  Yes   Income/Employment/Disability: Supported by [employer]  Armed forces logistics/support/administrative officer: No   Educational History: Education:  two year college degree in liberal arts  Religion/Sprituality/World  View: Catholic  Any cultural differences that may affect / interfere with treatment:  not applicable   Recreation/Hobbies: "fixing things" per patient report  Stressors: Financial difficulties    Strengths: working out, Journalist, newspaper, walking with grandson  Barriers:  none reported    Legal History: Pending legal issue / charges: The patient has no significant history of legal issues. History of legal issue / charges:  none  Medical History/Surgical History: reviewed Past Medical History:  Diagnosis Date   Allergy 1971   Seasonal allergies. 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     Beta Carotene (VITAMIN A) 25000 UNIT capsule Take 25,000 Units by mouth daily.     Bioflavonoid Products (BIOFLEX PO) Take by mouth.     cyanocobalamin ([employer] VITAMIN B12) 1000 MCG tablet Take by mouth.     doxazosin (CARDURA) 1 MG tablet Take 1 tablet by mouth at bedtime.     lisinopril (ZESTRIL) 30 MG tablet Take by mouth.     predniSONE (DELTASONE) 10 MG tablet Take 4 tablets (40 mg) for four days, then 2 tablets (20 mg) for four days, then 1 tablet (10 mg) for four days. 28 tablet 0   REPATHA SURECLICK XX123456 MG/ML SOAJ Inject 1 mL into the skin every 14 (fourteen) days.     Saw Palmetto, Serenoa repens, 450 MG CAPS Take by mouth.     vitamin k 100 MCG tablet Take 100 mcg by mouth daily.     No current facility-administered medications for this visit.    Allergies  Allergen Reactions   Nitroglycerin     Other  reaction(s): Other CAUSED PASSING OUT  CAUSED PASSING OUT     Erythromycin Rash    Ointment following conjunctivitis    Diagnoses:  Generalized anxiety disorder  Specific phobia, situational  Plan of Care: Patient is a 62 year old male who presented for an initial assessment. Clinician conducted initial assessment in person from clinician's office at [facility]. Patient reported anxiety as the reason for today's visit. Patient reported the following symptoms: difficulty falling asleep and staying asleep, racing thoughts at night, difficulty breathing, chest pressure, decreased concentration, worry, restlessness, and irritability. In addition, patient reported when in confined spaces he has experienced cold sweats, chest pressure, shortness of breath, and reported he avoids confined spaces. Patient stated, "I've always had it to a certain extent" in regards to symptoms of anxiety but reported symptoms increased with his last job. Patient denied current and past suicidal ideation, homicidal ideation, and symptoms of psychosis. Patient reported he drinks alcohol socially, approximately 1-2 times per month, with last use 2 week ago.  Patient reported no current or past tobacco use. Patient reported no current drug use. Patient reported a history of marijuana use over 35 years ago. Patient reported no history of inpatient or outpatient psychiatric treatment. Patient reported he is currently out of work due to a previous work related injury and reported finances are a current stressor. Patient identified his wife and friends as patient's support system. It is recommended patient be referred to a psychiatrist for a medication management consult and recommended patient participate in individual therapy. Clinician will review recommendations and treatment plan with patient during follow up appointment   Katherina Right, LCSW

## 2022-09-06 NOTE — Progress Notes (Signed)
Cephas Darby, MD [address]  [place]  [place], [place] [address]  Main: [phone]  Fax: [phone]    Gastroenterology Consultation  Referring Provider:     Michela Pitcher, NP Primary Care Physician:  Michela Pitcher, NP Primary Gastroenterologist:  Dr. Cephas Darby Reason for Consultation: Abdominal bloating, chronic diarrhea        HPI:   Geoffrey Orozco is a 62 y.o. male referred by  Michela Pitcher, NP  for consultation & management of abdominal bloating and chronic diarrhea.  Patient reports having meniscal tear in his right knee about 10 years ago.  Since then, he stopped working and has been taking care of his grandson at home.  He had history of quadruple bypass.  Since his knee surgery, he has gained weight, increased abdominal girth from size 34-38.  He started doing workout, going to gym and his main concern today is increased abdominal girth and significant abdominal bloating which is not able to get rid of.  He also reports having chronic symptoms of nonbloody diarrhea, anywhere from 2-5 bowel movements, some days are worse than other.  Denies any rectal bleeding.  He denies consuming any carbonated beverages, cut back on red meat, tries to eat healthy and denies any particular relation to food.  He reports he feels bloated on empty stomach and waking up in the morning.  Even water makes him feel significantly distended and gassy. Patient does state that he has nervous stomach and he had mild symptoms of diarrheal episodes whenever he is anxious going out on attending a meeting.  The symptoms have gotten worse within last 1 year He has mild insomnia He does not smoke or drink alcohol  NSAIDs: None  Antiplts/Anticoagulants/Anti thrombotics: None  GI Procedures: Colonoscopy 2014, normal He denies any family history of IBD, colorectal malignancy  Past Medical History:  Diagnosis Date   Allergy 1971   Seasonal allergies. 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   Asthma    Diabetes mellitus without complication ([place])    Hypertension    Sleep apnea 1998    Past Surgical History:  Procedure Laterality Date   CORONARY ARTERY BYPASS GRAFT  2009   Quadruple bypass   MENISCUS REPAIR Right    got hurt on the job   PALATE / UVULA BIOPSY / EXCISION       Current Outpatient Medications:    aspirin EC 81 MG tablet, Take by mouth., Disp: , Rfl:    Beta Carotene (VITAMIN A) 25000 UNIT capsule, Take 25,000 Units by mouth daily., Disp: , Rfl:    Bioflavonoid Products (BIOFLEX PO), Take by mouth., Disp: , Rfl:    cyanocobalamin ([facility] VITAMIN B12) 1000 MCG tablet, Take by mouth., Disp: , Rfl:    doxazosin (CARDURA) 1 MG tablet, Take 1 tablet by mouth at bedtime., Disp: , Rfl:    lisinopril (ZESTRIL) 30 MG tablet, Take by mouth., Disp: , Rfl:    predniSONE (DELTASONE) 10 MG tablet, Take 4 tablets (40 mg) for four days, then 2 tablets (20 mg) for four days, then 1 tablet (10 mg) for four days., Disp: 28 tablet, Rfl: 0   REPATHA SURECLICK XX123456 MG/ML SOAJ, Inject 1 mL into the skin every 14 (fourteen) days., Disp: , Rfl:    Saw Palmetto, Serenoa repens, 450 MG CAPS, Take by mouth., Disp: , Rfl:    vitamin k 100 MCG tablet, Take 100 mcg by mouth daily.,  Disp: , Rfl:    Family History  Problem Relation Age of Onset   Arthritis Mother    Glaucoma Mother    Heart disease Father        pacemaker   Hearing loss Father    Glaucoma Maternal Grandmother    Heart attack Maternal Grandfather 85     Social History   Tobacco Use   Smoking status: Never   Smokeless tobacco: Never  Vaping Use   Vaping Use: Never used  Substance Use Topics   Alcohol use: Yes    Comment: Social   Drug use: Never    Allergies as of 09/06/2022 - Review Complete 09/06/2022  Allergen Reaction Noted   Nitroglycerin  09/08/2020   Erythromycin Rash 08/28/2022    Review of Systems:    All systems reviewed and negative except where noted in HPI.    Physical Exam:  BP (!) 169/90 (BP Location: Right Arm, Patient Position: Sitting, Cuff Size: Normal)   Pulse 70   Temp 97.8 F (36.6 C) (Oral)   Ht '5\' 10"'$  (1.778 m)   Wt 227 lb 2 oz (103 kg)   BMI 32.59 kg/m  No LMP for male patient.  General:   Alert,  Well-developed, well-nourished, pleasant and cooperative in NAD Head:  Normocephalic and atraumatic. Eyes:  Sclera clear, no icterus.   Conjunctiva pink. Ears:  Normal auditory acuity. Nose:  No deformity, discharge, or lesions. Mouth:  No deformity or lesions,oropharynx pink & moist. Neck:  Supple; no masses or thyromegaly. Lungs:  Respirations even and unlabored.  Clear throughout to auscultation.   No wheezes, crackles, or rhonchi. No acute distress. Heart:  Regular rate and rhythm; no murmurs, clicks, rubs, or gallops. Abdomen:  Normal bowel sounds. Soft, non-tender and non-distended without masses, hepatosplenomegaly or hernias noted.  No guarding or rebound tenderness.   Rectal: Not performed Msk:  Symmetrical without gross deformities. Good, equal movement & strength bilaterally. Pulses:  Normal pulses noted. Extremities:  No clubbing or edema.  No cyanosis. Neurologic:  Alert and oriented x3;  grossly normal neurologically. Skin:  Intact without significant lesions or rashes. No jaundice. Psych:  Alert and cooperative. Normal mood and affect.  Imaging Studies: No abdominal imaging  Assessment and Plan:   Geoffrey Orozco is a 62 y.o. male with history of diabetes, CABG, hypertension, obstructive sleep apnea is seen in consultation for chronic abdominal bloating and chronic nonbloody diarrhea  Today, I have discussed with the patient regarding upper endoscopy and colonoscopy.  Patient reports that he has to coordinate with his wife's schedule in order to undergo these procedures In the interim, recommend stool studies GI profile PCR, calprotectin and pancreatic fecal elastase levels, H. pylori stool antigen, celiac panel,  alpha gal panel and food allergy profile Patient will call our office back to schedule upper endoscopy and colonoscopy based on above workup   Follow up in 3-4 months, contact via MyChart as needed   Cephas Darby, MD

## 2022-09-09 ENCOUNTER — Telehealth: Payer: Self-pay

## 2022-09-09 ENCOUNTER — Other Ambulatory Visit: Payer: Self-pay

## 2022-09-09 DIAGNOSIS — K529 Noninfective gastroenteritis and colitis, unspecified: Secondary | ICD-10-CM

## 2022-09-09 MED ORDER — NA SULFATE-K SULFATE-MG SULF 17.5-3.13-1.6 GM/177ML PO SOLN: 354.0000 mL | Freq: Once | ORAL | 0 refills | Status: AC

## 2022-09-09 NOTE — Telephone Encounter (Signed)
Patient states he called our office on Monday after his appointment to schedule his colonoscopy and EGD. Patient states he talk to [place] and Katha Cabal was supposed to send a message to the [facility]. Informed patient I did not received any message stating this information and I was very sorry about the situation and I would get the patient schedule. Got patient scheduled for 10/06/2022. Went over instructions, mailed them and sent to [employer]. Sent prep to the pharmacy

## 2022-09-09 NOTE — Telephone Encounter (Signed)
Patient called his rides can not bring him on 10/06/2022. We rescheduled to 10/12/2022. Called trish and reschedule to 10/12/2022. Mailed out new instructions.

## 2022-09-10 LAB — FOOD ALLERGY PROFILE
Allergen Corn, IgE: 3.99 kU/L — AB
Clam IgE: 2.07 kU/L — AB
Codfish IgE: 0.47 kU/L — AB
Egg White IgE: 0.3 kU/L — AB
Milk IgE: 0.44 kU/L — AB
Peanut IgE: 12 kU/L — AB
Scallop IgE: 2.54 kU/L — AB
Sesame Seed IgE: 11.2 kU/L — AB
Shrimp IgE: 1.95 kU/L — AB
Soybean IgE: 5.15 kU/L — AB
Walnut IgE: 3.49 kU/L — AB
Wheat IgE: 8.03 kU/L — AB

## 2022-09-10 LAB — ALPHA-GAL PANEL
Allergen Lamb IgE: 1.48 kU/L — AB
Beef IgE: 1.58 kU/L — AB
IgE (Immunoglobulin E), Serum: 2943 IU/mL — ABNORMAL HIGH (ref 6–495)
O215-IgE Alpha-Gal: <0.1 kU/L
Pork IgE: 1.39 kU/L — AB

## 2022-09-10 LAB — CELIAC DISEASE PANEL
Endomysial IgA: NEGATIVE
IgA/Immunoglobulin A, Serum: 179 mg/dL (ref 61–437)
Transglutaminase IgA: <2 U/mL (ref 0–3)

## 2022-09-15 ENCOUNTER — Telehealth: Payer: Self-pay

## 2022-09-15 MED ORDER — VANCOMYCIN HCL 125 MG PO CAPS: 125.0000 mg | ORAL_CAPSULE | Freq: Four times a day (QID) | ORAL | 0 refills | Status: AC

## 2022-09-15 NOTE — Telephone Encounter (Signed)
Patient verbalized understanding of results. [facility]

## 2022-09-15 NOTE — Telephone Encounter (Signed)
-----   Message from Lin Landsman, MD sent at 09/15/2022 11:29 AM EDT ----- Stool studies positive for C Diff and adenovirus. Recommend oral vancomycin 125mg  PO QID for 10days. If no improvement of recurs after finishing the treatment, he should let us know He will be done with abx by the time of his procedures  RV

## 2022-09-18 LAB — GI PROFILE, STOOL, PCR
Adenovirus F 40/41: DETECTED — AB
Astrovirus: NOT DETECTED
C difficile toxin A/B: DETECTED — AB
Campylobacter: NOT DETECTED
Cryptosporidium: NOT DETECTED
Cyclospora cayetanensis: NOT DETECTED
Entamoeba histolytica: NOT DETECTED
Enteroaggregative E coli: NOT DETECTED
Enteropathogenic E coli: NOT DETECTED
Enterotoxigenic E coli: NOT DETECTED
Giardia lamblia: NOT DETECTED
Norovirus GI/GII: NOT DETECTED
Plesiomonas shigelloides: NOT DETECTED
Rotavirus A: NOT DETECTED
Salmonella: NOT DETECTED
Sapovirus: NOT DETECTED
Shiga-toxin-producing E coli: NOT DETECTED
Shigella/Enteroinvasive E coli: NOT DETECTED
Vibrio cholerae: NOT DETECTED
Vibrio: NOT DETECTED
Yersinia enterocolitica: NOT DETECTED

## 2022-09-18 LAB — H. PYLORI ANTIGEN, STOOL: H pylori Ag, Stl: NEGATIVE

## 2022-09-18 LAB — CALPROTECTIN, FECAL: Calprotectin, Fecal: 49 ug/g (ref 0–120)

## 2022-09-18 LAB — PANCREATIC ELASTASE, FECAL: Pancreatic Elastase, Fecal: >500 ug Elast./g (ref >200)

## 2022-09-22 ENCOUNTER — Encounter: Payer: Self-pay | Admitting: Nurse Practitioner

## 2022-09-22 ENCOUNTER — Ambulatory Visit (INDEPENDENT_AMBULATORY_CARE_PROVIDER_SITE_OTHER): Payer: BC Managed Care – PPO | Admitting: Nurse Practitioner

## 2022-09-22 ENCOUNTER — Encounter: Payer: Self-pay | Admitting: *Deleted

## 2022-09-22 VITALS — BP 118/82 | HR 70 | Temp 98.3°F | Resp 16 | Ht 70.0 in | Wt 224.1 lb

## 2022-09-22 DIAGNOSIS — I2510 Atherosclerotic heart disease of native coronary artery without angina pectoris: Secondary | ICD-10-CM

## 2022-09-22 DIAGNOSIS — I10 Essential (primary) hypertension: Secondary | ICD-10-CM | POA: Diagnosis not present

## 2022-09-22 DIAGNOSIS — Z125 Encounter for screening for malignant neoplasm of prostate: Secondary | ICD-10-CM

## 2022-09-22 DIAGNOSIS — N5089 Other specified disorders of the male genital organs: Secondary | ICD-10-CM | POA: Insufficient documentation

## 2022-09-22 DIAGNOSIS — G4733 Obstructive sleep apnea (adult) (pediatric): Secondary | ICD-10-CM

## 2022-09-22 DIAGNOSIS — Z0000 Encounter for general adult medical examination without abnormal findings: Secondary | ICD-10-CM | POA: Diagnosis not present

## 2022-09-22 DIAGNOSIS — R7303 Prediabetes: Secondary | ICD-10-CM

## 2022-09-22 NOTE — Assessment & Plan Note (Signed)
Pending A1c 

## 2022-09-22 NOTE — Patient Instructions (Signed)
Nice to see you today I will be in touch with the labs once I have them Follow up with me in 1 year, sooner if you need me   Make a fasting lab appointment over the next 2 weeks Call and schedule the ultrasound when you are ready

## 2022-09-22 NOTE — Assessment & Plan Note (Signed)
Patient currently maintained on Cardura and lisinopril.  Patient tolerates medications well is followed by Dr. Jac Canavan continue medication as prescribed follow-up as recommended

## 2022-09-22 NOTE — Assessment & Plan Note (Signed)
Discussed age-appropriate immunizations and screening exams.  Reviewed patient's personal, surgical, family, social history.  Patient up-to-date on vaccines.  Patient has colonoscopy scheduled for CRC screening.  PSA for prostate cancer screening.  Patient was given information at discharge about preventative healthcare maintenance with anticipatory guidance.

## 2022-09-22 NOTE — Assessment & Plan Note (Signed)
Patient states has been going on for years but will swell larger and hurt pending ultrasound.  Patient given information to call and get ultrasound scheduled at his convenience

## 2022-09-22 NOTE — Assessment & Plan Note (Signed)
Patient not currently on CPAP he had a UPPP

## 2022-09-22 NOTE — Progress Notes (Signed)
Established Patient Office Visit  Subjective   Patient ID: Geoffrey Orozco, male    DOB: 12/02/1960  Age: 62 y.o. MRN: [id number]  Chief Complaint  Patient presents with   Annual Exam    HPI  HTN: Patient currently maintained on doxazosin 1 mg and lisinopril 30 mg.  Is followed by Dr. Jac Canavan  CAD: Patient has a history of a CABG currently on Repatha  OSA: Patient has surgery no longer has trouble and no current CPAP use  for complete physical and follow up of chronic conditions.  Immunizations: -Tetanus: Completed in 2018 -Influenza: Completed this season -Shingles: Up-to-date per [facility] -Pneumonia: Too young -COVID: Up-to-date Moderna   Diet: Fair diet. States he is eatin 3 times a day. Occ snacks. water Exercise: No regular exercise. States that he is walking 5 days a week for 23mins at a time. Doing the kettle bell 3 days a  Eye exam: Completes annually. Glasses for distance and at night   Dental exam: Completes semi-annually    Colonoscopy: Patient is scheduled on 10/12/2022 with Dr. Marius Ditch Lung Cancer Screening: N/A  PSA: Due  Sleep: states that he goes to bed around 11 and gets up aroud 7. Feels rested. States that he got a new pillow and may snore some        Review of Systems  Constitutional:  Negative for chills and fever.  Respiratory:  Negative for shortness of breath.   Cardiovascular:  Negative for chest pain and leg swelling.  Gastrointestinal:  Negative for abdominal pain, blood in stool, constipation, diarrhea, nausea and vomiting.  Genitourinary:  Negative for dysuria and hematuria.       Nocturia once a night  Neurological:  Negative for tingling and headaches.  Psychiatric/Behavioral:  Negative for hallucinations and suicidal ideas.       Objective:     BP 118/82   Pulse 70   Temp 98.3 F (36.8 C)   Resp 16   Ht 5\' 10"  (1.778 m)   Wt 224 lb 2 oz (101.7 kg)   SpO2 97%   BMI 32.16 kg/m    Physical  Exam Vitals and nursing note reviewed. Exam conducted with a chaperone present Beatriz Stallion, CMA).  Constitutional:      Appearance: Normal appearance.  HENT:     Right Ear: Tympanic membrane, ear canal and external ear normal.     Left Ear: Tympanic membrane, ear canal and external ear normal.     Mouth/Throat:     Mouth: Mucous membranes are moist.     Pharynx: Oropharynx is clear.     Comments: Missing his uvula Eyes:     Extraocular Movements: Extraocular movements intact.     Pupils: Pupils are equal, round, and reactive to light.  Cardiovascular:     Rate and Rhythm: Normal rate and regular rhythm.     Pulses: Normal pulses.     Heart sounds: Normal heart sounds.  Pulmonary:     Effort: Pulmonary effort is normal.     Breath sounds: Normal breath sounds.  Abdominal:     General: Bowel sounds are normal. There is no distension.     Palpations: There is no mass.     Tenderness: There is no abdominal tenderness.     Hernia: No hernia is present. There is no hernia in the left inguinal area or right inguinal area.  Genitourinary:    Penis: Normal.      Testes:  Right: Mass and tenderness present.     Epididymis:     Right: Normal.     Left: Normal.  Musculoskeletal:     Right lower leg: No edema.     Left lower leg: No edema.  Lymphadenopathy:     Cervical: No cervical adenopathy.     Lower Body: No right inguinal adenopathy. No left inguinal adenopathy.  Skin:    General: Skin is warm.  Neurological:     General: No focal deficit present.     Mental Status: He is alert.     Deep Tendon Reflexes:     Reflex Scores:      Bicep reflexes are 2+ on the right side and 2+ on the left side.      Patellar reflexes are 2+ on the right side and 2+ on the left side.    Comments: Bilateral upper and lower extremity strength 5/5  Psychiatric:        Mood and Affect: Mood normal.        Behavior: Behavior normal.        Thought Content: Thought content normal.         Judgment: Judgment normal.      No results found for any visits on 09/22/22.    The ASCVD Risk score (Arnett DK, et al., 2019) failed to calculate for the following reasons:   Cannot find a previous HDL lab    Assessment & Plan:   Problem List Items Addressed This Visit       Cardiovascular and Mediastinum   Coronary artery disease involving native coronary artery of native heart without angina pectoris   Relevant Orders   Lipid panel   HTN (hypertension)    Patient currently maintained on Cardura and lisinopril.  Patient tolerates medications well is followed by Dr. Jac Canavan continue medication as prescribed follow-up as recommended      Relevant Orders   TSH     Respiratory   OSA (obstructive sleep apnea)    Patient not currently on CPAP he had a UPPP        Other   Enlarged testicle - Primary    Patient states has been going on for years but will swell larger and hurt pending ultrasound.  Patient given information to call and get ultrasound scheduled at his convenience      Relevant Orders   US SCROTUM W/DOPPLER   Preventative health care    Discussed age-appropriate immunizations and screening exams.  Reviewed patient's personal, surgical, family, social history.  Patient up-to-date on vaccines.  Patient has colonoscopy scheduled for CRC screening.  PSA for prostate cancer screening.  Patient was given information at discharge about preventative healthcare maintenance with anticipatory guidance.      Relevant Orders   CBC   Comprehensive metabolic panel   TSH   Prediabetes    Pending A1c      Relevant Orders   Hemoglobin A1c   Other Visit Diagnoses     Screening for prostate cancer       Relevant Orders   PSA       Return in about 1 year (around 09/22/2023) for CPE and Labs.    Romilda Garret, NP

## 2022-09-26 ENCOUNTER — Other Ambulatory Visit: Payer: Self-pay | Admitting: Nurse Practitioner

## 2022-10-04 ENCOUNTER — Ambulatory Visit (INDEPENDENT_AMBULATORY_CARE_PROVIDER_SITE_OTHER): Payer: BC Managed Care – PPO | Admitting: Clinical

## 2022-10-04 DIAGNOSIS — F40298 Other specified phobia: Secondary | ICD-10-CM | POA: Diagnosis not present

## 2022-10-04 DIAGNOSIS — F411 Generalized anxiety disorder: Secondary | ICD-10-CM | POA: Diagnosis not present

## 2022-10-04 NOTE — Progress Notes (Signed)
  [facility] Counselor/Therapist Progress Note  Patient ID: Geoffrey Orozco, MRN: [id number]    Date: 10/04/22  Time Spent: 8:34  am - 9:20 am : 46 Minutes  Treatment Type: Individual Therapy.  Reported Symptoms: Patient reported recent improvement in sleep and mood since last session  Mental Status Exam: Appearance:  Well Groomed     Behavior: Appropriate  Motor: Normal  Speech/Language:  Clear and Coherent  Affect: Tearful  Mood: normal Patient stated, "ok" in response to mood.  Thought process: normal  Thought content:   WNL  Sensory/Perceptual disturbances:   WNL  Orientation: oriented to person, place, and situation  Attention: Good  Concentration: Good  Memory: WNL  Fund of knowledge:  Good  Insight:   Good  Judgment:  Good  Impulse Control: Good   Risk Assessment: Danger to Self:  No Patient denied current suicidal ideation  Self-injurious Behavior: No Danger to Others: No Patient denied current homicidal ideation  Duty to Warn:no Physical Aggression / Violence:No  Access to Firearms a concern: No  Gang Involvement:No   Subjective:  Patient stated, "kind of hectic" in response to events since last session. Patient reported his father in law passed away and they attended his funeral. Patient reported he continues to search for employment. Patient stated, "its not stressful yet for me" in regards to his employment status. Patient stated, "a little bit better" in response to mood since last session. Patient reported some improvement in sleep since last session. Patient stated, "ok" in response to current mood. Patient reported no questions about diagnoses. Patient reported he is open to participation in therapy. Patient declined a referral to a psychiatrist. Patient stated, "I over think things". Patient stated, "I want to be able to laugh more out loud". Patient stated, "I keep too many things bottled up, I want to let them all out and not keep them  bottled up". Patient stated, "I don't want to bother other people with my problems" in response to barriers to expressing his thoughts/feelings.  Interventions: Motivational Interviewing Clinician conducted session in person at clinician's office at [facility].. Reviewed events and mood since last session. Assessed current mood. Clinician reviewed diagnoses and treatment recommendations. Provided psycho education related to diagnoses and treatment. Clinician utilized motivational interviewing to explore potential goals for therapy. Clinician requested for homework patient identify what he would like to see change in his life by participating in therapy.   Diagnosis:  Generalized anxiety disorder  Specific phobia, situational    Plan: Goals to be determined during follow up appointment on 11/16/22                     Doree Barthel, LCSW

## 2022-10-05 ENCOUNTER — Telehealth: Payer: Self-pay | Admitting: Gastroenterology

## 2022-10-05 NOTE — Telephone Encounter (Signed)
Pt stated that medication he was given for bloating in March hasn't worked he would like a call back to discuss a different medication

## 2022-10-06 ENCOUNTER — Other Ambulatory Visit (INDEPENDENT_AMBULATORY_CARE_PROVIDER_SITE_OTHER): Payer: BC Managed Care – PPO

## 2022-10-06 DIAGNOSIS — I2510 Atherosclerotic heart disease of native coronary artery without angina pectoris: Secondary | ICD-10-CM | POA: Diagnosis not present

## 2022-10-06 DIAGNOSIS — R7303 Prediabetes: Secondary | ICD-10-CM

## 2022-10-06 DIAGNOSIS — Z0000 Encounter for general adult medical examination without abnormal findings: Secondary | ICD-10-CM | POA: Diagnosis not present

## 2022-10-06 DIAGNOSIS — Z125 Encounter for screening for malignant neoplasm of prostate: Secondary | ICD-10-CM

## 2022-10-06 DIAGNOSIS — I10 Essential (primary) hypertension: Secondary | ICD-10-CM

## 2022-10-06 LAB — COMPREHENSIVE METABOLIC PANEL
ALT: 70 U/L — ABNORMAL HIGH (ref 0–53)
AST: 39 U/L — ABNORMAL HIGH (ref 0–37)
Albumin: 4.6 g/dL (ref 3.5–5.2)
Alkaline Phosphatase: 59 U/L (ref 39–117)
BUN: 22 mg/dL (ref 6–23)
CO2: 28 mEq/L (ref 19–32)
Calcium: 9.7 mg/dL (ref 8.4–10.5)
Chloride: 104 mEq/L (ref 96–112)
Creatinine, Ser: 0.96 mg/dL (ref 0.40–1.50)
GFR: 84.51 mL/min (ref >60.00)
Glucose, Bld: 92 mg/dL (ref 70–99)
Potassium: 4.3 mEq/L (ref 3.5–5.1)
Sodium: 141 mEq/L (ref 135–145)
Total Bilirubin: 0.8 mg/dL (ref 0.2–1.2)
Total Protein: 6.9 g/dL (ref 6.0–8.3)

## 2022-10-06 LAB — LIPID PANEL
Cholesterol: 131 mg/dL (ref 0–200)
HDL: 47 mg/dL (ref >39.00)
LDL Cholesterol: 54 mg/dL (ref 0–99)
NonHDL: 84.44
Total CHOL/HDL Ratio: 3
Triglycerides: 152 mg/dL — ABNORMAL HIGH (ref 0.0–149.0)
VLDL: 30.4 mg/dL (ref 0.0–40.0)

## 2022-10-06 LAB — PSA: PSA: 2.99 ng/mL (ref 0.10–4.00)

## 2022-10-06 LAB — CBC
HCT: 44.5 % (ref 39.0–52.0)
Hemoglobin: 15 g/dL (ref 13.0–17.0)
MCHC: 33.8 g/dL (ref 30.0–36.0)
MCV: 87.2 fl (ref 78.0–100.0)
Platelets: 259 10*3/uL (ref 150.0–400.0)
RBC: 5.1 Mil/uL (ref 4.22–5.81)
RDW: 13.6 % (ref 11.5–15.5)
WBC: 4.8 10*3/uL (ref 4.0–10.5)

## 2022-10-06 LAB — HEMOGLOBIN A1C: Hgb A1c MFr Bld: 5.7 % (ref 4.6–6.5)

## 2022-10-06 LAB — TSH: TSH: 2.11 u[IU]/mL (ref 0.35–5.50)

## 2022-10-06 NOTE — Telephone Encounter (Signed)
Patient has alpha gal deficiency and he cannot tolerate beef and pork based on the blood test result.  Recommend him to strictly avoid beef and pork at least for a month and see if it helps with bloating.  Also, he has several food allergies.  Recommend referral to allergy and immunology if patient is agreeable   I will see him for EGD and colonoscopy as scheduled  Geoffrey Orozco

## 2022-10-06 NOTE — Telephone Encounter (Signed)
Patient states she is not having diarrhea anymore. He states he is still having abdominal bloating. He states it is all day long but is worse if he eats to much he is having  LUQ pain that comes and goes.

## 2022-10-07 ENCOUNTER — Ambulatory Visit: Payer: BC Managed Care – PPO

## 2022-10-07 NOTE — Telephone Encounter (Signed)
Patient verbalized understanding of instructions. He states he will stay away from beef and pork for 1 month fist and then he will let us know how is abdominal bloating is. If he is still having the abdominal bloating then we can placed referral to Allergy

## 2022-10-10 ENCOUNTER — Telehealth: Payer: Self-pay

## 2022-10-10 NOTE — Telephone Encounter (Signed)
Called patient and went over what he can eat. We went over the Suprep directions.

## 2022-10-10 NOTE — Telephone Encounter (Signed)
Pt called left message  he has a few questions about procedure please return call

## 2022-10-11 ENCOUNTER — Encounter: Payer: Self-pay | Admitting: Gastroenterology

## 2022-10-12 ENCOUNTER — Ambulatory Visit: Payer: BC Managed Care – PPO | Admitting: Anesthesiology

## 2022-10-12 ENCOUNTER — Telehealth: Payer: Self-pay

## 2022-10-12 ENCOUNTER — Encounter
Admission: RE | Disposition: A | Discharge status: Home / Self Care | Payer: Self-pay | Source: Home / Self Care | Attending: Gastroenterology

## 2022-10-12 ENCOUNTER — Ambulatory Visit
Admission: RE | Admit: 2022-10-12 | Discharge: 2022-10-12 | Disposition: A | Discharge status: Home / Self Care | Payer: BC Managed Care – PPO | Attending: Gastroenterology | Admitting: Gastroenterology

## 2022-10-12 ENCOUNTER — Encounter: Payer: Self-pay | Admitting: Gastroenterology

## 2022-10-12 DIAGNOSIS — D124 Benign neoplasm of descending colon: Secondary | ICD-10-CM | POA: Diagnosis not present

## 2022-10-12 DIAGNOSIS — G4730 Sleep apnea, unspecified: Secondary | ICD-10-CM | POA: Diagnosis not present

## 2022-10-12 DIAGNOSIS — Z79899 Other long term (current) drug therapy: Secondary | ICD-10-CM | POA: Insufficient documentation

## 2022-10-12 DIAGNOSIS — K635 Polyp of colon: Secondary | ICD-10-CM | POA: Diagnosis not present

## 2022-10-12 DIAGNOSIS — I10 Essential (primary) hypertension: Secondary | ICD-10-CM | POA: Diagnosis not present

## 2022-10-12 DIAGNOSIS — K529 Noninfective gastroenteritis and colitis, unspecified: Secondary | ICD-10-CM

## 2022-10-12 DIAGNOSIS — K2100 Gastro-esophageal reflux disease with esophagitis, without bleeding: Secondary | ICD-10-CM | POA: Insufficient documentation

## 2022-10-12 DIAGNOSIS — Z951 Presence of aortocoronary bypass graft: Secondary | ICD-10-CM | POA: Insufficient documentation

## 2022-10-12 DIAGNOSIS — D120 Benign neoplasm of cecum: Secondary | ICD-10-CM | POA: Insufficient documentation

## 2022-10-12 DIAGNOSIS — Z6831 Body mass index (BMI) 31.0-31.9, adult: Secondary | ICD-10-CM | POA: Diagnosis not present

## 2022-10-12 DIAGNOSIS — I2510 Atherosclerotic heart disease of native coronary artery without angina pectoris: Secondary | ICD-10-CM | POA: Diagnosis not present

## 2022-10-12 DIAGNOSIS — K2980 Duodenitis without bleeding: Secondary | ICD-10-CM | POA: Insufficient documentation

## 2022-10-12 DIAGNOSIS — R140 Abdominal distension (gaseous): Secondary | ICD-10-CM | POA: Diagnosis not present

## 2022-10-12 HISTORY — PX: COLONOSCOPY WITH PROPOFOL: SHX5780

## 2022-10-12 HISTORY — PX: ESOPHAGOGASTRODUODENOSCOPY (EGD) WITH PROPOFOL: SHX5813

## 2022-10-12 HISTORY — DX: Prediabetes: R73.03

## 2022-10-12 SURGERY — COLONOSCOPY WITH PROPOFOL
Anesthesia: General

## 2022-10-12 MED ORDER — GLYCOPYRROLATE 0.2 MG/ML IJ SOLN
INTRAMUSCULAR | Status: DC | PRN
  Administered 2022-10-12: .1 mg via INTRAVENOUS

## 2022-10-12 MED ORDER — PROPOFOL 500 MG/50ML IV EMUL
INTRAVENOUS | Status: DC | PRN
  Administered 2022-10-12: 100 ug/kg/min via INTRAVENOUS

## 2022-10-12 MED ORDER — LIDOCAINE HCL (CARDIAC) PF 100 MG/5ML IV SOSY
PREFILLED_SYRINGE | INTRAVENOUS | Status: DC | PRN
  Administered 2022-10-12: 50 mg via INTRAVENOUS

## 2022-10-12 MED ORDER — PROPOFOL 10 MG/ML IV BOLUS
INTRAVENOUS | Status: DC | PRN
  Administered 2022-10-12: 100 mg via INTRAVENOUS
  Administered 2022-10-12: 30 mg via INTRAVENOUS
  Administered 2022-10-12: 50 mg via INTRAVENOUS

## 2022-10-12 MED ORDER — SODIUM CHLORIDE 0.9 % IV SOLN: INTRAVENOUS | Status: DC

## 2022-10-12 MED ORDER — OMEPRAZOLE 40 MG PO CPDR: 40.0000 mg | DELAYED_RELEASE_CAPSULE | Freq: Every day | ORAL | 0 refills | Status: DC

## 2022-10-12 MED ORDER — PROPOFOL 10 MG/ML IV BOLUS
INTRAVENOUS | Status: AC
  Filled 2022-10-12: qty 40

## 2022-10-12 NOTE — Telephone Encounter (Signed)
Per Dr. Gasper Sells send Prilosec  daily before meals for 3 months, erosive esophagitis. Sent medication to the pharmacy

## 2022-10-12 NOTE — H&P (Signed)
Arlyss Repress, MD [address]  [address]  [place], [place] [address]  Main: [phone]  Fax: [phone] Pager: [phone]  Primary Care Physician:  Eden Emms, NP Primary Gastroenterologist:  Dr. Arlyss Repress  Pre-Procedure History & Physical: HPI:  Darryl Willner is a 62 y.o. male is here for an endoscopy and colonoscopy.   Past Medical History:  Diagnosis Date   Allergy 1971   Seasonal allergies. 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 Authorizing Provider  azelastine (ASTELIN) 0.1 % nasal spray Place 2 sprays into both nostrils 2 (two) times daily. Use in each nostril as directed   Yes [provider]  doxazosin (CARDURA) 1 MG tablet Take 1 tablet by mouth at bedtime. 09/08/20  Yes [provider]  lisinopril (ZESTRIL) 30 MG tablet Take by mouth. 03/29/21  Yes [provider]  aspirin EC 81 MG tablet Take by mouth.    [provider]  Beta Carotene (VITAMIN A) 25000 UNIT capsule Take 25,000 Units by mouth daily.    [provider]  Bioflavonoid Products (BIOFLEX PO) Take by mouth.    [provider]  cyanocobalamin ([facility] VITAMIN B12) 1000 MCG tablet Take by mouth.    [provider]  REPATHA SURECLICK 140 MG/ML SOAJ Inject 1 mL into the skin every 14 (fourteen) days. 03/04/22   [provider]  Saw Palmetto, Serenoa repens, 450 MG CAPS Take by mouth.    [provider]  vitamin k 100 MCG tablet Take 100 mcg by mouth daily.    [provider]    Allergies as of 09/08/2061 - Review Complete 09/06/2022  Allergen Reaction  Noted   Nitroglycerin  09/08/2020   Erythromycin Rash 08/28/2022    Family History  Problem Relation Age of Onset   Arthritis Mother    Glaucoma Mother    Heart disease Father        pacemaker   Hearing loss Father    Glaucoma Maternal Grandmother    Heart attack Maternal Grandfather 35    Social History   Socioeconomic History   Marital status: Married    Spouse name: Elease Hashimoto   Number of children: 3   Years of education: Some   Highest education level: Not on file  Occupational History   Not on file  Tobacco Use   Smoking status: Never   Smokeless tobacco: Never  Vaping Use   Vaping Use: Never used  Substance and Sexual Activity   Alcohol use: Yes    Comment: Social   Drug use: Never   Sexual activity: Yes    Birth control/protection: None  Other Topics Concern   Not on file  Social History Narrative   Fulltime: Mr [employer]   Out since 11/19/2021      29 [address] 27, [place] 26   Social Determinants of Health   Financial Resource Strain: Not on file  Food Insecurity: Not on file  Transportation Needs: Not on file  Physical Activity: Not on file  Stress: Not on file  Social Connections: Not on file  Intimate Partner Violence: Not on file    Review of Systems: See HPI, otherwise negative ROS  Physical Exam: BP (!) 151/92   Pulse 66   Temp (!) 96.8 F (36 C) (Temporal)   Resp 18   Ht  (1.778 m)   Wt 99.2 kg   SpO2 100%   BMI 31.39 kg/m  General:   Alert,  pleasant and cooperative in NAD Head:  Normocephalic and atraumatic. Neck:  Supple; no masses or thyromegaly. Lungs:  Clear throughout to auscultation.    Heart:  Regular rate and rhythm. Abdomen:  Soft, nontender and nondistended. Normal bowel sounds, without guarding, and without rebound.   Neurologic:  Alert and  oriented x4;  grossly normal neurologically.  Impression/Plan: Cornelius Marullo is here for an endoscopy and colonoscopy to be performed for  Abdominal  bloating, chronic diarrhea   Risks, benefits, limitations, and alternatives regarding  endoscopy and colonoscopy have been reviewed with the patient.  Questions have been answered.  All parties agreeable.   Lannette Donath, MD  10/12/2022, 10:44 AM

## 2022-10-12 NOTE — Anesthesia Postprocedure Evaluation (Signed)
Anesthesia Post Note  Patient: Geoffrey Orozco  Procedure(s) Performed: COLONOSCOPY WITH PROPOFOL ESOPHAGOGASTRODUODENOSCOPY (EGD) WITH PROPOFOL  Patient location during evaluation: PACU Anesthesia Type: General Level of consciousness: awake and oriented Pain management: pain level controlled Vital Signs Assessment: post-procedure vital signs reviewed and stable Respiratory status: spontaneous breathing and respiratory function stable Cardiovascular status: blood pressure returned to baseline Anesthetic complications: no   No notable events documented.   Last Vitals:  Vitals:   10/12/22 1232 10/12/22 1242  BP: (!) 104/92 125/82  Pulse: 74 75  Resp: 16 15  Temp: (!) 35.8 C   SpO2: 99% 99%    Last Pain:  Vitals:   10/12/22 1242  TempSrc:   PainSc: 0-No pain                 VAN STAVEREN,Katanya Schlie

## 2022-10-12 NOTE — Op Note (Signed)
[facility] Patient Name: Geoffrey Orozco Procedure Date: 10/12/2022 11:28 AM MRN: [id number] Account #: [id number] Date of Birth: [birth year] Admit Type: Outpatient Age: 62 Room: [facility] ENDO ROOM 4 Gender: Male Note Status: Finalized Instrument Name: Colonoscope [id number] Procedure:             Colonoscopy Indications:           This is the patient's first colonoscopy, Chronic                         diarrhea Providers:             Toney Reil MD, MD Referring MD:          No Local Md, MD (Referring MD) Medicines:             General Anesthesia Complications:         No immediate complications. Estimated blood loss: None. Procedure:             Pre-Anesthesia Assessment:                        - Prior to the procedure, a History and Physical was                         performed, and patient medications and allergies were                         reviewed. The patient is competent. The risks and                         benefits of the procedure and the sedation options and                         risks were discussed with the patient. All questions                         were answered and informed consent was obtained.                         Patient identification and proposed procedure were                         verified by the physician, the nurse, the                         anesthesiologist, the anesthetist and the technician                         in the pre-procedure area in the procedure room in the                         endoscopy suite. Mental Status Examination: alert and                         oriented. Airway Examination: normal oropharyngeal                         airway and neck mobility. Respiratory Examination:  clear to auscultation. CV Examination: normal.                         Prophylactic Antibiotics: The patient does not require                         prophylactic antibiotics. Prior  Anticoagulants: The                         patient has taken no anticoagulant or antiplatelet                         agents. ASA Grade Assessment: III - A patient with                         severe systemic disease. After reviewing the risks and                         benefits, the patient was deemed in satisfactory                         condition to undergo the procedure. The anesthesia                         plan was to use general anesthesia. Immediately prior                         to administration of medications, the patient was                         re-assessed for adequacy to receive sedatives. The                         heart rate, respiratory rate, oxygen saturations,                         blood pressure, adequacy of pulmonary ventilation, and                         response to care were monitored throughout the                         procedure. The physical status of the patient was                         re-assessed after the procedure.                        After obtaining informed consent, the colonoscope was                         passed under direct vision. Throughout the procedure,                         the patient's blood pressure, pulse, and oxygen                         saturations were monitored continuously. The  Colonoscope was introduced through the anus and                         advanced to the 10 cm into the ileum. The colonoscopy                         was performed without difficulty. The patient                         tolerated the procedure well. The quality of the bowel                         preparation was evaluated using the BBPS [facility] Bowel                         Preparation Scale) with scores of: Right Colon = 3,                         Transverse Colon = 3 and Left Colon = 3 (entire mucosa                         seen well with no residual staining, small fragments                         of stool or  opaque liquid). The total BBPS score                         equals 9. The terminal ileum, ileocecal valve,                         appendiceal orifice, and rectum were photographed. Findings:      The perianal and digital rectal examinations were normal. Pertinent       negatives include normal sphincter tone and no palpable rectal lesions.      The terminal ileum appeared normal. Biopsies were taken with a cold       forceps for histology.      Normal mucosa was found in the left colon and in the right colon.       Biopsies were taken with a cold forceps for histology.      Two sessile polyps were found in the descending colon and cecum. The       polyps were 5 to 6 mm in size. These polyps were removed with a cold       snare. Resection and retrieval were complete. Estimated blood loss: none.      Non-bleeding external hemorrhoids were found during retroflexion. The       hemorrhoids were medium-sized. Impression:            - The examined portion of the ileum was normal.                         Biopsied.                        - Normal mucosa in the left colon and in the right  colon. Biopsied.                        - Two 5 to 6 mm polyps in the descending colon and in                         the cecum, removed with a cold snare. Resected and                         retrieved.                        - Non-bleeding external hemorrhoids. Recommendation:        - Discharge patient to home (with escort).                        - Resume previous diet today.                        - Continue present medications.                        - Await pathology results.                        - Repeat colonoscopy in 5 years for surveillance. Procedure Code(s):     --- Professional ---                        [phone], Colonoscopy, flexible; with removal of                         tumor(s), polyp(s), or other lesion(s) by snare                         technique                         [id number], 59, Colonoscopy, flexible; with biopsy, single                         or multiple Diagnosis Code(s):     --- Professional ---                        K64.4, Residual hemorrhoidal skin tags                        D12.0, Benign neoplasm of cecum                        D12.4, Benign neoplasm of descending colon                        K52.9, Noninfective gastroenteritis and colitis,                         unspecified CPT copyright 2022 American Medical Association. All rights reserved. The codes documented in this report are preliminary and upon coder review may  be revised to meet current compliance requirements. Dr. Libby Maw Toney Reil MD, MD 10/12/2022 12:20:05 PM This report has been signed electronically. Number of Addenda: 0  Note Initiated On: 10/12/2022 11:28 AM Scope Withdrawal Time: 0 hours 15 minutes 1 second  Total Procedure Duration: 0 hours 19 minutes 10 seconds  Estimated Blood Loss:  Estimated blood loss: none.      [facility]

## 2022-10-12 NOTE — Anesthesia Preprocedure Evaluation (Signed)
Anesthesia Evaluation  Patient identified by MRN, date of birth, ID band Patient awake    Reviewed: Allergy & Precautions, NPO status , Patient's Chart, lab work & pertinent test results  Airway Mallampati: II  TM Distance: >3 FB Neck ROM: Full    Dental  (+) Teeth Intact   Pulmonary neg pulmonary ROS, shortness of breath, asthma , sleep apnea    Pulmonary exam normal breath sounds clear to auscultation       Cardiovascular Exercise Tolerance: Good hypertension, Pt. on medications + CAD and + CABG  negative cardio ROS Normal cardiovascular exam Rhythm:Regular Rate:Normal     Neuro/Psych  PSYCHIATRIC DISORDERS Anxiety     negative neurological ROS  negative psych ROS   GI/Hepatic negative GI ROS, Neg liver ROS,,,  Endo/Other  negative endocrine ROS  Morbid obesity  Renal/GU negative Renal ROS  negative genitourinary   Musculoskeletal  (+) Arthritis ,    Abdominal  (+) + obese  Peds negative pediatric ROS (+)  Hematology negative hematology ROS (+)   Anesthesia Other Findings Past Medical History: 1971: Allergy     Comment:  Seasonal allergies. Grass, trees, flowers 2018: Arthritis     Comment:  Hands No date: Asthma No date: Hypertension No date: Pre-diabetes 1998: Sleep apnea  Past Surgical History: 2009: CORONARY ARTERY BYPASS GRAFT     Comment:  Quadruple bypass 2023: MENISCUS REPAIR; Right     Comment:  got hurt on the job No date: PALATE / UVULA BIOPSY / EXCISION  BMI    Body Mass Index: 31.39 kg/m      Reproductive/Obstetrics negative OB ROS                             Anesthesia Physical Anesthesia Plan  ASA: 3  Anesthesia Plan: General   Post-op Pain Management:    Induction: Intravenous  PONV Risk Score and Plan: Propofol infusion and TIVA  Airway Management Planned: Natural Airway  Additional Equipment:   Intra-op Plan:   Post-operative Plan:    Informed Consent: I have reviewed the patients History and Physical, chart, labs and discussed the procedure including the risks, benefits and alternatives for the proposed anesthesia with the patient or authorized representative who has indicated his/her understanding and acceptance.     Dental Advisory Given  Plan Discussed with: CRNA and Surgeon  Anesthesia Plan Comments:        Anesthesia Quick Evaluation

## 2022-10-12 NOTE — Op Note (Signed)
[facility] Patient Name: Geoffrey Orozco Procedure Date: 10/12/2022 11:29 AM MRN: [id number] Account #: [id number] Date of Birth: 05-04-1961 Admit Type: Outpatient Age: 62 Room: [facility] ENDO ROOM 4 Gender: Male Note Status: Finalized Instrument Name: Upper Endoscope [id number] Procedure:             Upper GI endoscopy Indications:           Abdominal bloating, Diarrhea Providers:             Toney Reil MD, MD Referring MD:          No Local Md, MD (Referring MD) Medicines:             General Anesthesia Complications:         No immediate complications. Estimated blood loss: None. Procedure:             Pre-Anesthesia Assessment:                        - Prior to the procedure, a History and Physical was                         performed, and patient medications and allergies were                         reviewed. The patient is competent. The risks and                         benefits of the procedure and the sedation options and                         risks were discussed with the patient. All questions                         were answered and informed consent was obtained.                         Patient identification and proposed procedure were                         verified by the physician, the nurse, the                         anesthesiologist, the anesthetist and the technician                         in the pre-procedure area in the procedure room in the                         endoscopy suite. Mental Status Examination: alert and                         oriented. Airway Examination: normal oropharyngeal                         airway and neck mobility. Respiratory Examination:                         clear to auscultation. CV Examination: normal.  Prophylactic Antibiotics: The patient does not require                         prophylactic antibiotics. Prior Anticoagulants: The                          patient has taken no anticoagulant or antiplatelet                         agents. ASA Grade Assessment: III - A patient with                         severe systemic disease. After reviewing the risks and                         benefits, the patient was deemed in satisfactory                         condition to undergo the procedure. The anesthesia                         plan was to use general anesthesia. Immediately prior                         to administration of medications, the patient was                         re-assessed for adequacy to receive sedatives. The                         heart rate, respiratory rate, oxygen saturations,                         blood pressure, adequacy of pulmonary ventilation, and                         response to care were monitored throughout the                         procedure. The physical status of the patient was                         re-assessed after the procedure.                        After obtaining informed consent, the endoscope was                         passed under direct vision. Throughout the procedure,                         the patient's blood pressure, pulse, and oxygen                         saturations were monitored continuously. The Endoscope                         was introduced through the mouth, and advanced to the  second part of duodenum. The upper GI endoscopy was                         accomplished without difficulty. The patient tolerated                         the procedure well. Findings:      The duodenal bulb and second portion of the duodenum were normal.       Biopsies were taken with a cold forceps for histology.      The entire examined stomach was normal. Biopsies were taken with a cold       forceps for histology.      The cardia and gastric fundus were normal on retroflexion.      Esophagogastric landmarks were identified: the gastroesophageal junction       was found  at 40 cm from the incisors.      LA Grade B (one or more mucosal breaks greater than 5 mm, not extending       between the tops of two mucosal folds) esophagitis with no bleeding was       found at the gastroesophageal junction.      The examined esophagus was normal. Biopsies were taken with a cold       forceps for histology. Impression:            - Normal duodenal bulb and second portion of the                         duodenum. Biopsied.                        - Normal stomach. Biopsied.                        - Esophagogastric landmarks identified.                        - LA Grade B reflux esophagitis with no bleeding.                        - Normal esophagus. Biopsied. Recommendation:        - Await pathology results.                        - Follow an antireflux regimen.                        - Use a proton pump inhibitor PO BID for 2 months.                        - Proceed with colonoscopy as scheduled                        See colonoscopy report Procedure Code(s):     --- Professional ---                        [phone], Esophagogastroduodenoscopy, flexible,                         transoral; with biopsy, single or multiple Diagnosis Code(s):     --- Professional ---  K21.00, Gastro-esophageal reflux disease with                         esophagitis, without bleeding                        R14.0, Abdominal distension (gaseous)                        R19.7, Diarrhea, unspecified CPT copyright 2022 American Medical Association. All rights reserved. The codes documented in this report are preliminary and upon coder review may  be revised to meet current compliance requirements. Dr. Libby Maw Toney Reil MD, MD 10/12/2022 11:56:59 AM This report has been signed electronically. Number of Addenda: 0 Note Initiated On: 10/12/2022 11:29 AM Estimated Blood Loss:  Estimated blood loss: none.      [facility]

## 2022-10-12 NOTE — Transfer of Care (Signed)
Immediate Anesthesia Transfer of Care Note  Patient: Geoffrey Orozco  Procedure(s) Performed: COLONOSCOPY WITH PROPOFOL ESOPHAGOGASTRODUODENOSCOPY (EGD) WITH PROPOFOL  Patient Location: PACU  Anesthesia Type:General  Level of Consciousness: awake, alert , and oriented  Airway & Oxygen Therapy: Patient Spontanous Breathing and Patient connected to nasal cannula oxygen  Post-op Assessment: Report given to RN and Post -op Vital signs reviewed and stable  Post vital signs: Reviewed and stable  Last Vitals:  Vitals Value Taken Time  BP 100/61 10/12/22 1222  Temp    Pulse 77 10/12/22 1222  Resp 12 10/12/22 1222  SpO2 97 % 10/12/22 1222    Last Pain:  Vitals:   10/12/22 1222  TempSrc:   PainSc: Asleep         Complications: No notable events documented.

## 2022-10-13 ENCOUNTER — Encounter: Payer: Self-pay | Admitting: Gastroenterology

## 2022-10-13 LAB — SURGICAL PATHOLOGY

## 2022-10-14 ENCOUNTER — Encounter: Payer: Self-pay | Admitting: Gastroenterology

## 2022-10-20 ENCOUNTER — Telehealth: Payer: Self-pay | Admitting: Gastroenterology

## 2022-10-20 DIAGNOSIS — Z91018 Allergy to other foods: Secondary | ICD-10-CM

## 2022-10-20 NOTE — Telephone Encounter (Signed)
Patient is wanting a referral to dietitian. Patient states he tried to call and make a appointment and they said they needed a referral from his provider referring him.   [address] with [employer] [phone]

## 2022-10-20 NOTE — Telephone Encounter (Signed)
Called and left a message for call back  

## 2022-10-20 NOTE — Telephone Encounter (Signed)
Pt left message in ref to referral to dietitan please return call

## 2022-10-21 NOTE — Telephone Encounter (Signed)
Placed referral and informed patient  

## 2022-10-21 NOTE — Telephone Encounter (Signed)
Okay, please send in the referral to dietitian Multiple food allergies  RV

## 2022-10-21 NOTE — Addendum Note (Signed)
Addended by: Radene Knee L on: 10/21/2022 09:46 AM   Modules accepted: Orders

## 2022-10-26 ENCOUNTER — Encounter: Payer: Self-pay | Admitting: Skilled Nursing Facility1

## 2022-10-26 ENCOUNTER — Encounter: Payer: BC Managed Care – PPO | Attending: Gastroenterology | Admitting: Skilled Nursing Facility1

## 2022-10-26 DIAGNOSIS — R140 Abdominal distension (gaseous): Secondary | ICD-10-CM

## 2022-10-26 DIAGNOSIS — K529 Noninfective gastroenteritis and colitis, unspecified: Secondary | ICD-10-CM

## 2022-10-26 NOTE — Telephone Encounter (Signed)
error 

## 2022-10-26 NOTE — Progress Notes (Signed)
Medical Nutrition Therapy  Appointment Start time:  3:04  Appointment End time:  3:09  Primary concerns today: belly distention   Referral diagnosis: abdominal distention   NUTRITION ASSESSMENT   Clinical Medical Hx: allergies, arthritis, asthma, OSA, HTN Medications: lisinopril, omeprazole, vitamin K,  Notable Signs/Symptoms: 5 formed stools up to 5 in 1 day happening once in every 3 months: no lose stools and no constipation   Lifestyle & Dietary Hx  Pt states he currently takes care of his 73 month old grandson during the day.  Pt states he ah shad a bloated stomach since September but with no pain just distended to look at.  Pt states he is the typical guy that if there is a rare side effect he will experience it. Pt states he got allergy tested for many foods and came back allergic to all of it.  Pt states he weighs himself nightly.   Pt states since being active and sleeping well the bloat was a bit better.   Allergy food test popped: Peanut  Soybean: Clam Shrimp Walnut Scallop Wheat Corn Sesame   Estimated daily fluid intake:  Supplements: vitamin D, vitamin K, vitamin A, L-argenenine, ginko beloba  Sleep: no issues reported  Stress / self-care: was working for 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   Handouts Provided Include  Detailed MyPlate  Learning Style & Readiness for Change Teaching method utilized: Visual & Auditory   Demonstrated degree of understanding via: Teach Back  Barriers to learning/adherence to lifestyle change: none identified   Goals Established by Pt Step 1: Avoid all beef, pork, lamb, venison, jerky completely, don't forget lunch meat Step 2: If bloat still present: Avoid all dairy for 6 weeks and then reintroduce paying attnetion to bloat and bowel habits; start with cheese then yogurt then milk Try alkaline water ph 8.8 in the water aisle at the grocery store  Avoid wheat if your symptoms do not subside after cutting out the red meats and dairy  incorporate fruits and non starchy veggies every day: 2-3 servings fruits and 2.5 cups non starchy vegetables   MONITORING & EVALUATION Dietary intake, weekly physical activity  Next Steps  Patient is to call or email for a follow up if the bloat does not resolve with the complete exclusion of alpha gal meats.

## 2022-11-16 ENCOUNTER — Ambulatory Visit: Payer: BC Managed Care – PPO | Admitting: Clinical

## 2022-11-22 ENCOUNTER — Ambulatory Visit: Payer: BC Managed Care – PPO | Admitting: Clinical

## 2022-12-05 ENCOUNTER — Other Ambulatory Visit: Payer: Self-pay

## 2022-12-07 NOTE — Progress Notes (Signed)
Geoffrey Amy, PA-C [address]  [address]  [place], [place] [address]  Main: [phone]  Fax: [phone]   Primary Care Physician: Eden Emms, NP  Primary Gastroenterologist:  Dr. Lannette Donath / Geoffrey Amy, PA-C   CC: F/U Chronic Bloating and Chronic Diarrhea  HPI: Geoffrey Orozco is a 62 y.o. male, who saw Dr. Allegra Lai for bloating and diarrhea 08/2022, returns for 50-month follow-up.  Patient states he has no more diarrhea.  He is avoiding beef and pork.  Bloating has decreased.  Stomach feels better.  He has seen a nutritionist.  He has not seen an allergist and needs referral for multiple food allergies.  He states he has had environmental allergies since he was a child.  Used to receive allergy shots as a child.  Reports eczema rash on his arms and occasionally legs.  No rash on his torso.  He tried omeprazole for few days which caused diarrhea and he discontinued.  No current treatment for acid reflux.  He does not feel heartburn or chest pain.  Denies abdominal pain.  Currently having 3 formed BMs daily with no diarrhea.  Overall, GI symptoms have improved.  He rarely drinks alcohol.  NORMAL RESULTS:  in the past 3 months showed negative H. pylori, negative celiac, normal pancreatic elastase, normal fecal calprotectin, normal TSH and CBC.    ABNORMAL RESULTS: CMP labs showed mildly elevated liver transaminases with AST 39, ALT 70.  Elevated triglycerides 152.  Positive Food Allergy Panal.  Positive alpha gal.  GI pathogen panal was positive for Adenovirus and C. Diff A/B.  C. difficile was treated with vancomycin 125 mg 4 times daily for 10 days.  He was advised to avoid beef and pork.  Also referred to allergist and dietitian for further evaluation and treatment of multiple food allergies.  EGD 10/12/22: LA grade B reflux esophagitis, normal stomach.  Biopsies negative for celiac, H. pylori, EOE, and Barrett's.  He was started on Prilosec 40 Mg daily for GERD with  esophagitis.  Colonoscopy 10/12/22:  2 small 5 to 6 mm tubular adenoma polyps removed, medium external hemorrhoids, otherwise normal.  Biopsies negative for microscopic colitis and IBD.  Repeat in 5 years.   Current Outpatient Medications  Medication Sig Dispense Refill   aspirin EC 81 MG tablet Take by mouth.     azelastine (ASTELIN) 0.1 % nasal spray Place 2 sprays into both nostrils 2 (two) times daily. Use in each nostril as directed     Beta Carotene (VITAMIN A) 25000 UNIT capsule Take 25,000 Units by mouth daily.     Bioflavonoid Products (BIOFLEX PO) Take by mouth.     doxazosin (CARDURA) 1 MG tablet Take 1 tablet by mouth at bedtime.     REPATHA SURECLICK 140 MG/ML SOAJ Inject 1 mL into the skin every 14 (fourteen) days.     Saw Palmetto, Serenoa repens, 450 MG CAPS Take by mouth.     vitamin k 100 MCG tablet Take 100 mcg by mouth daily.     No current facility-administered medications for this visit.    Allergies as of 12/08/2022 - Review Complete 12/08/2022  Allergen Reaction Noted   Nitroglycerin  09/08/2020   Erythromycin Rash 08/28/2022    Past Medical History:  Diagnosis Date   Allergy 1971   Seasonal allergies. 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  Needs to be last case with Dr Allegra Lai in case he is still c-diff. 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   Physical Examination:   BP (!) 158/91   Pulse 64   Temp 97.8 F (36.6 C)   Ht 5\' 7"  (1.702 m)   Wt 220 lb 3.2 oz (99.9 kg)   BMI 34.49 kg/m   General: Well-nourished, well-developed in no acute distress.  Eyes: No icterus. Conjunctivae pink. Extremities: No lower extremity edema.  Neuro: Alert and oriented x 3.  Grossly intact. Skin: Warm and dry, no jaundice.  Mild eczematous rash on his arms, flexor surface of elbows. Psych: Alert and cooperative, normal mood and affect.   Imaging Studies: No results found.  Assessment and Plan:   Geoffrey Orozco is a 62 y.o. y/o male returns for 93-month follow-up of chronic bloating and chronic diarrhea.  Extensive GI testing showed positive food allergy panel, positive alpha gal, positive adenovirus, and positive C. difficile.  He was treated with vancomycin 125mg  4 times daily with benefit.  Diarrhea has improved.  Continues to have abdominal bloating.  He has been referred to an allergist and dietitian for further management.  Also avoiding beef and pork.  Recent EGD was negative for celiac and H. pylori.  He has GERD with esophagitis and started on Prilosec.  Colonoscopy showed 2 small adenomatous polyps removed.  Biopsies negative for microscopic colitis and IBD.  Repeat colonoscopy in 5 years.  1.  Multiple food allergies  Refer to Allergist  2.  Positive alpha gal  Continue to avoid beef and pork.  F/U with Nutritionist.  3.  History of C. difficile and adenovirus - Resolved.  Diarrhea has currently resolved.  No further treatment is necessary.  If he has recurrent diarrhea, then check C. difficile toxin PCR.  4.  GERD with esophagitis confirmed on EGD.  Silent acid reflux.  He Does not feel heartburn.  Start Famotidine 20mg  1 tablet twice daily.   This can help with GERD and allergies.  He could not tolerate omeprazole which caused diarrhea. Recommend Lifestyle Modifications to prevent Acid Reflux.  Rec. Avoid coffee, sodas, peppermint, citrus fruits, and spicey foods.  Avoid eating 2-3 hours before bedtime.   5.  Adenomatous colon polyps  Repeat colonoscopy in 5 - 7 years  6.  Chronic bloating - Improved since he is avoiding beef and pork.  Avoid food allergens.  Avoid beef and pork.  F/U with Allergist for Multiple Food Allergies.  7.  Elevated liver transaminases - Mildly elevated ALT > AST  Schedule right upper quadrant abdominal ultrasound, evaluate for fatty liver.  Lab: Repeat Hepatic Panal    Geoffrey Amy, PA-C  Follow up in As Needed.

## 2022-12-08 ENCOUNTER — Other Ambulatory Visit: Payer: Self-pay | Admitting: Physician Assistant

## 2022-12-08 ENCOUNTER — Telehealth: Payer: Self-pay

## 2022-12-08 ENCOUNTER — Ambulatory Visit (INDEPENDENT_AMBULATORY_CARE_PROVIDER_SITE_OTHER): Payer: BC Managed Care – PPO | Admitting: Physician Assistant

## 2022-12-08 ENCOUNTER — Encounter: Payer: Self-pay | Admitting: Physician Assistant

## 2022-12-08 VITALS — BP 158/91 | HR 64 | Temp 97.8°F | Ht 67.0 in | Wt 220.2 lb

## 2022-12-08 DIAGNOSIS — Z860100 Personal history of colon polyps, unspecified: Secondary | ICD-10-CM

## 2022-12-08 DIAGNOSIS — R140 Abdominal distension (gaseous): Secondary | ICD-10-CM

## 2022-12-08 DIAGNOSIS — R7989 Other specified abnormal findings of blood chemistry: Secondary | ICD-10-CM

## 2022-12-08 DIAGNOSIS — K2100 Gastro-esophageal reflux disease with esophagitis, without bleeding: Secondary | ICD-10-CM | POA: Diagnosis not present

## 2022-12-08 DIAGNOSIS — Z91018 Allergy to other foods: Secondary | ICD-10-CM

## 2022-12-08 MED ORDER — FAMOTIDINE 20 MG PO TABS
20.0000 mg | ORAL_TABLET | Freq: Two times a day (BID) | ORAL | 3 refills | Status: AC
Start: 2022-12-08 — End: 2023-12-03

## 2022-12-08 NOTE — Telephone Encounter (Signed)
Spoke with patient -Ultrasound scheduled 12-09-22 8:15 am [facility] and he will come to office today between 1-4 pm to have labs drawn.

## 2022-12-08 NOTE — Telephone Encounter (Signed)
Faxed office notes, insurance card information and demographics to [facility].

## 2022-12-08 NOTE — Patient Instructions (Addendum)
Ultrasound scheduled 8:15 [facility] . Nothing to eat/drink after midnight.     Referral to [facility]. Someone from their office will contact you to schedule an appointment.   Their contact number is [phone]  [address] [facility] [address]  [facility] [address]  [address] [place] [address]

## 2022-12-09 ENCOUNTER — Ambulatory Visit
Admission: RE | Admit: 2022-12-09 | Discharge: 2022-12-09 | Disposition: A | Discharge status: Home / Self Care | Payer: BC Managed Care – PPO | Source: Ambulatory Visit | Attending: Physician Assistant | Admitting: Physician Assistant

## 2022-12-09 ENCOUNTER — Telehealth: Payer: Self-pay

## 2022-12-09 DIAGNOSIS — R7989 Other specified abnormal findings of blood chemistry: Secondary | ICD-10-CM | POA: Insufficient documentation

## 2022-12-09 LAB — HEPATIC FUNCTION PANEL
ALT: 66 IU/L — ABNORMAL HIGH (ref 0–44)
AST: 37 IU/L (ref 0–40)
Albumin: 4.7 g/dL (ref 3.9–4.9)
Alkaline Phosphatase: 82 IU/L (ref 44–121)
Bilirubin Total: 0.8 mg/dL (ref 0.0–1.2)
Bilirubin, Direct: 0.2 mg/dL (ref 0.00–0.40)
Total Protein: 7.3 g/dL (ref 6.0–8.5)

## 2022-12-09 NOTE — Progress Notes (Signed)
Notify patient liver tests have improved from previous labs 2 months ago.  All liver test are normal except ALT which is mildly elevated.  Not worrisome.  Continue with plan for abdominal ultrasound as scheduled.  I recommend repeat liver tests in 6 months to monitor.

## 2022-12-09 NOTE — Telephone Encounter (Signed)
Left detailed message  regarding labs. Placed in recall box for 6 month to follow up on labs.   Notify patient liver tests have improved from previous labs 2 months ago.  All liver test are normal except ALT which is mildly elevated.  Not worrisome.  Continue with plan for abdominal ultrasound as scheduled.  I recommend repeat liver tests in 6 months to monitor.

## 2022-12-11 NOTE — Progress Notes (Signed)
Notify patient right upper quadrant abdominal ultrasound shows small amount of sludge in the gallbladder.  No evidence of gallbladder infection.  Normal common bile duct.  No gallstones.  Liver shows increased echogenicity, consistent with fatty liver.  No liver lesions.  Suspect fatty liver caused to his recent mild elevated liver enzymes which have improved.  We recommend low-fat diet, regular exercise, and weight loss.  Monitor liver labs every 6 months.  Limit alcohol use.  If patient develops moderate right upper quadrant pain with nausea/vomiting after eating, please let us know as these can be symptoms of gallbladder disease.  We do not recommend gallbladder surgery unless he is having symptoms.

## 2022-12-12 ENCOUNTER — Telehealth: Payer: Self-pay

## 2022-12-12 NOTE — Telephone Encounter (Signed)
Patient notified.  Placed in recall box for labs in 6 months.        Notify patient right upper quadrant abdominal ultrasound shows small amount of sludge in the gallbladder.  No evidence of gallbladder infection.  Normal common bile duct.  No gallstones.  Liver shows increased echogenicity, consistent with fatty liver.  No liver lesions.  Suspect fatty liver caused to his recent mild elevated liver enzymes which have improved.  We recommend low-fat diet, regular exercise, and weight loss.  Monitor liver labs every 6 months.  Limit alcohol use.  If patient develops moderate right upper quadrant pain with nausea/vomiting after eating, please let us know as these can be symptoms of gallbladder disease.  We do not recommend gallbladder surgery unless he is having symptoms.

## 2023-01-16 ENCOUNTER — Telehealth: Payer: Self-pay | Admitting: Gastroenterology

## 2023-01-16 NOTE — Telephone Encounter (Signed)
He states he Went to see Dr. Willeen Cass And he did not alpha gal and he could not do the food allergy testing at his office but he had to refer him to [facility]. He states that [facility] also said he did not have alpha gal but they also said he did not have any food allergy. He states he is back to square one with the abdominal bloating. He has appointment with you on 01/18/2023. Please advise if you have any recommendation before appointment or just wait till he is seen. I did tell him you might not have anything to say till his appointment on Wednesday

## 2023-01-16 NOTE — Telephone Encounter (Signed)
Pt left message to discuss his concerns  about Alpha-gal and food allergies he would like to discuss at his next visit on 01/18/2023

## 2023-01-18 ENCOUNTER — Ambulatory Visit: Payer: BC Managed Care – PPO | Admitting: Gastroenterology

## 2023-01-18 ENCOUNTER — Encounter: Payer: Self-pay | Admitting: Gastroenterology

## 2023-01-18 ENCOUNTER — Other Ambulatory Visit: Payer: Self-pay

## 2023-01-18 VITALS — BP 163/92 | HR 66 | Temp 98.3°F | Ht 67.0 in | Wt 227.0 lb

## 2023-01-18 DIAGNOSIS — K580 Irritable bowel syndrome with diarrhea: Secondary | ICD-10-CM | POA: Diagnosis not present

## 2023-01-18 DIAGNOSIS — K760 Fatty (change of) liver, not elsewhere classified: Secondary | ICD-10-CM | POA: Diagnosis not present

## 2023-01-18 DIAGNOSIS — R140 Abdominal distension (gaseous): Secondary | ICD-10-CM | POA: Diagnosis not present

## 2023-01-18 MED ORDER — RIFAXIMIN 550 MG PO TABS: 550.0000 mg | ORAL_TABLET | Freq: Three times a day (TID) | ORAL | 0 refills | Status: AC

## 2023-01-18 NOTE — Progress Notes (Signed)
Arlyss Repress, MD [address]  [address]  [place], [place] [address]  Main: [phone]  Fax: [phone]    Gastroenterology Consultation  Referring Provider:     Eden Emms, NP Primary Care Physician:  Eden Emms, NP Primary Gastroenterologist:  Dr. Arlyss Repress Reason for Consultation: Abdominal bloating, chronic diarrhea        HPI:   Geoffrey Orozco is a 62 y.o. male referred by  Eden Emms, NP  for consultation & management of abdominal bloating and chronic diarrhea.  Patient reports having meniscal tear in his right knee about 10 years ago.  Since then, he stopped working and has been taking care of his grandson at home.  He had history of quadruple bypass.  Since his knee surgery, he has gained weight, increased abdominal girth from size 34-38.  He started doing workout, going to gym and his main concern today is increased abdominal girth and significant abdominal bloating which is not able to get rid of.  He also reports having chronic symptoms of nonbloody diarrhea, anywhere from 2-5 bowel movements, some days are worse than other.  Denies any rectal bleeding.  He denies consuming any carbonated beverages, cut back on red meat, tries to eat healthy and denies any particular relation to food.  He reports he feels bloated on empty stomach and waking up in the morning.  Even water makes him feel significantly distended and gassy. Patient does state that he has nervous stomach and he had mild symptoms of diarrheal episodes whenever he is anxious going out on attending a meeting.  The symptoms have gotten worse within last 1 year He has mild insomnia He does not smoke or drink alcohol  Follow-up visit 01/18/2023 Patient is here for follow-up of persistent abdominal bloating.  Since last visit with me, he underwent food allergy profile which revealed several food intolerances, his IgE levels were elevated.  Celiac disease panel negative.  Patient reports that  he felt slightly better after eliminating meat products.  He also consulted allergy and immunology at [facility] who did not think he has food allergies.  His elevated IgE levels are secondary to seasonal allergies.  Patient is frustrated with ongoing abdominal bloating even though he claims that he eats healthy and exercises daily.  He checks his weight daily in the morning and noticed that he gained few pounds although he did not make any changes in his diet or physical activity.  He had C. difficile came back positive in 3/24 which was treated.  He also had adenovirus.  He reports having 2 formed bowel movements daily and occasional spurts of diarrhea.  He reports bloating when he wakes up in the morning and sometimes progresses throughout the day. His EGD in 4/24 revealed erosive esophagitis for which she tried omeprazole which resulted in diarrhea.  Currently taking Pepcid only  NSAIDs: None  Antiplts/Anticoagulants/Anti thrombotics: None  GI Procedures: EGD and colonoscopy 10/12/2022 LA grade B esophagitis, otherwise normal stomach and duodenum, biopsies performed  Normal terminal ileum, biopsy Normal colon mucosa, biopsied 2 subcentimeter polyps were resected External hemorrhoids  Colonoscopy 2014, normal He denies any family history of IBD, colorectal malignancy  Past Medical History:  Diagnosis Date   Allergy 1971   Seasonal allergies. 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  Needs to be last case with Dr Allegra Lai in case he is still c-diff. ([facility])   CORONARY ARTERY BYPASS GRAFT  2009   Quadruple bypass    ESOPHAGOGASTRODUODENOSCOPY (EGD) WITH PROPOFOL N/A 10/12/2022   Procedure: ESOPHAGOGASTRODUODENOSCOPY (EGD) WITH PROPOFOL;  Surgeon: Toney Reil, MD;  Location: [facility] ENDOSCOPY;  Service: Gastroenterology;  Laterality: N/A;   MENISCUS REPAIR Right 2023   got hurt on the job   PALATE / UVULA BIOPSY / EXCISION       Current Outpatient Medications:    aspirin EC 81 MG tablet, Take by mouth., Disp: , Rfl:    azelastine (ASTELIN) 0.1 % nasal spray, Place 2 sprays into both nostrils 2 (two) times daily. Use in each nostril as directed, Disp: , Rfl:    Bioflavonoid Products (BIOFLEX PO), Take by mouth., Disp: , Rfl:    doxazosin (CARDURA) 1 MG tablet, Take 1 tablet by mouth at bedtime., Disp: , Rfl:    EPINEPHrine 0.3 mg/0.3 mL IJ SOAJ injection, PLEASE SEE ATTACHED FOR DETAILED DIRECTIONS, Disp: , Rfl:    famotidine (PEPCID) 20 MG tablet, Take 1 tablet (20 mg total) by mouth 2 (two) times daily., Disp: 180 tablet, Rfl: 3   fluticasone (FLONASE) 50 MCG/ACT nasal spray, Place 2 sprays into both nostrils daily., Disp: , Rfl:    REPATHA SURECLICK 140 MG/ML SOAJ, Inject 1 mL into the skin every 14 (fourteen) days., Disp: , Rfl:    Saw Palmetto, Serenoa repens, 450 MG CAPS, Take by mouth., Disp: , Rfl:    Family History  Problem Relation Age of Onset   Arthritis Mother    Glaucoma Mother    Heart disease Father        pacemaker   Hearing loss Father    Glaucoma Maternal Grandmother    Heart attack Maternal Grandfather 57     Social History   Tobacco Use   Smoking status: Never   Smokeless tobacco: Never  Vaping Use   Vaping status: Never Used  Substance Use Topics   Alcohol use: Yes    Comment: Social   Drug use: Never    Allergies as of 01/18/2023 - Review Complete 01/18/2023  Allergen Reaction Noted   Nitroglycerin  09/08/2020   Erythromycin Rash 08/28/2022    Review of Systems:    All systems reviewed and negative except where noted in HPI.   Physical Exam:  BP (!)  160/88 (BP Location: Left Arm, Patient Position: Sitting, Cuff Size: Normal)   Pulse 65   Temp 98.3 F (36.8 C) (Oral)   Ht 5\' 7"  (1.702 m)   Wt 227 lb (103 kg)   BMI 35.55 kg/m  No LMP for male patient.  General:   Alert,  Well-developed, well-nourished, pleasant and cooperative in NAD Head:  Normocephalic and atraumatic. Eyes:  Sclera clear, no icterus.   Conjunctiva pink. Ears:  Normal auditory acuity. Nose:  No deformity, discharge, or lesions. Mouth:  No deformity or lesions,oropharynx pink & moist. Neck:  Supple; no masses or thyromegaly. Lungs:  Respirations even and unlabored.  Clear throughout to auscultation.   No wheezes, crackles, or rhonchi. No acute distress. Heart:  Regular rate and rhythm; no murmurs, clicks, rubs, or gallops. Abdomen:  Normal bowel sounds. Soft, non-tender and non-distended without  masses, hepatosplenomegaly or hernias noted.  No guarding or rebound tenderness.   Rectal: Not performed Msk:  Symmetrical without gross deformities. Good, equal movement & strength bilaterally. Pulses:  Normal pulses noted. Extremities:  No clubbing or edema.  No cyanosis. Neurologic:  Alert and oriented x3;  grossly normal neurologically. Skin:  Intact without significant lesions or rashes. No jaundice. Psych:  Alert and cooperative. Normal mood and affect.  Imaging Studies: No abdominal imaging  Assessment and Plan:   Geoffrey Orozco is a 62 y.o. male with metabolic syndrome, history of diabetes, CABG, hypertension, obstructive sleep apnea is seen in consultation for chronic abdominal bloating and chronic nonbloody intermittent diarrhea  Chronic abdominal bloating with intermittent diarrhea Food allergy profile revealed several food intolerances Discussed with patient regarding empiric trial of probiotics, antibiotic for possible bacterial overgrowth, low FODMAP diet, IBgard.  He opted to try antibiotic first Sent in prescription for rifaximin 550 mg 3 times  daily for 2 weeks  Fatty liver Elevated ALT Continue healthy diet and exercise Check viral hepatitis panel Recheck LFTs in 6 months, if persistently elevated, recommend secondary liver disease workup   Follow up in 6 months, contact via MyChart as needed   Arlyss Repress, MD

## 2023-03-20 ENCOUNTER — Other Ambulatory Visit: Payer: Self-pay

## 2023-03-20 ENCOUNTER — Emergency Department: Payer: BC Managed Care – PPO

## 2023-03-20 DIAGNOSIS — R197 Diarrhea, unspecified: Secondary | ICD-10-CM | POA: Insufficient documentation

## 2023-03-20 DIAGNOSIS — R55 Syncope and collapse: Secondary | ICD-10-CM | POA: Diagnosis present

## 2023-03-20 DIAGNOSIS — R112 Nausea with vomiting, unspecified: Secondary | ICD-10-CM | POA: Diagnosis not present

## 2023-03-20 DIAGNOSIS — J45909 Unspecified asthma, uncomplicated: Secondary | ICD-10-CM | POA: Diagnosis not present

## 2023-03-20 DIAGNOSIS — I10 Essential (primary) hypertension: Secondary | ICD-10-CM | POA: Insufficient documentation

## 2023-03-20 DIAGNOSIS — N179 Acute kidney failure, unspecified: Secondary | ICD-10-CM | POA: Diagnosis not present

## 2023-03-20 DIAGNOSIS — I2510 Atherosclerotic heart disease of native coronary artery without angina pectoris: Secondary | ICD-10-CM | POA: Diagnosis not present

## 2023-03-20 LAB — CBC
HCT: 47.2 % (ref 39.0–52.0)
Hemoglobin: 16 g/dL (ref 13.0–17.0)
MCH: 28.8 pg (ref 26.0–34.0)
MCHC: 33.9 g/dL (ref 30.0–36.0)
MCV: 84.9 fL (ref 80.0–100.0)
Platelets: 281 10*3/uL (ref 150–400)
RBC: 5.56 MIL/uL (ref 4.22–5.81)
RDW: 13.1 % (ref 11.5–15.5)
WBC: 13.8 10*3/uL — ABNORMAL HIGH (ref 4.0–10.5)
nRBC: 0 % (ref 0.0–0.2)

## 2023-03-20 LAB — COMPREHENSIVE METABOLIC PANEL
ALT: 80 U/L — ABNORMAL HIGH (ref 0–44)
AST: 46 U/L — ABNORMAL HIGH (ref 15–41)
Albumin: 5 g/dL (ref 3.5–5.0)
Alkaline Phosphatase: 58 U/L (ref 38–126)
Anion gap: 15 (ref 5–15)
BUN: 23 mg/dL (ref 8–23)
CO2: 24 mmol/L (ref 22–32)
Calcium: 10.3 mg/dL (ref 8.9–10.3)
Chloride: 101 mmol/L (ref 98–111)
Creatinine, Ser: 1.72 mg/dL — ABNORMAL HIGH (ref 0.61–1.24)
GFR, Estimated: 44 mL/min — ABNORMAL LOW (ref >60)
Glucose, Bld: 115 mg/dL — ABNORMAL HIGH (ref 70–99)
Potassium: 4 mmol/L (ref 3.5–5.1)
Sodium: 140 mmol/L (ref 135–145)
Total Bilirubin: 1 mg/dL (ref 0.3–1.2)
Total Protein: 8.2 g/dL — ABNORMAL HIGH (ref 6.5–8.1)

## 2023-03-20 LAB — TROPONIN I (HIGH SENSITIVITY): Troponin I (High Sensitivity): 12 ng/L (ref <18)

## 2023-03-20 NOTE — ED Triage Notes (Signed)
Pt presents to ER with c/o LOC that happened around 1900 tonight.  Pt states he started to feel weird around dinner time, and went to go to bathroom.  Pt states he was sitting on toilet and having an episode of diarrhea.  Pt states he called for his wife, who states pt passed out, and had an episode of vomiting.  Pt's wife is concerned for a possible seizure.  No hx of seizures.  Pt states he has ha da similar episode after taking ntg before.  Pt otherwise A&O x4 and in NAD at this time.

## 2023-03-21 ENCOUNTER — Emergency Department
Admission: EM | Admit: 2023-03-21 | Discharge: 2023-03-21 | Disposition: A | Discharge status: Home / Self Care | Payer: BC Managed Care – PPO | Attending: Emergency Medicine | Admitting: Emergency Medicine

## 2023-03-21 DIAGNOSIS — R55 Syncope and collapse: Secondary | ICD-10-CM

## 2023-03-21 DIAGNOSIS — N179 Acute kidney failure, unspecified: Secondary | ICD-10-CM

## 2023-03-21 LAB — TROPONIN I (HIGH SENSITIVITY): Troponin I (High Sensitivity): 8 ng/L (ref <18)

## 2023-03-21 MED ORDER — ONDANSETRON HCL 4 MG PO TABS: 4.0000 mg | ORAL_TABLET | Freq: Every day | ORAL | 1 refills | Status: DC | PRN

## 2023-03-21 NOTE — Discharge Instructions (Addendum)
Take Zofran for nausea as needed.  Drink plenty of fluids to stay well-hydrated.  Find Pedialyte or similar electrolyte rehydration formulas at your local pharmacy.  Call your doctor for an appointment this week or next week to recheck your kidney numbers.  Thank you for choosing [place] for your health care today!  Please see your primary doctor this week for a follow up appointment.   If you have any new, worsening, or unexpected symptoms call your doctor right away or come back to the emergency department for reevaluation.  It was my pleasure to care for you today.   Daneil Dan Modesto Charon, MD

## 2023-03-21 NOTE — ED Provider Notes (Signed)
[facility] Provider Note    Event Date/Time   First MD Initiated Contact with Patient 03/21/23 0206     (approximate)   History   Loss of Consciousness   HPI  Geoffrey Orozco is a 62 y.o. male   Past medical history of CAD, asthma, hypertension, sleep apnea, had a episode of syncope earlier this evening.    He was in his regular state of health, went outside to mow the lawn, came back inside to eat dinner and had a flushed feeling fullness feeling in his face, went to the bathroom and had diarrhea nausea and vomiting.  During this episode he syncopized.  It was witnessed by his wife.  No tonic-clonic activity, brief loss of consciousness, and was awake alert oriented afterwards.  He denied any chest pain, palpitations or shortness of breath.  Feels back to baseline afterwards.   Independent Historian contributed to assessment above: His wife is at bedside corroborates information given above       Physical Exam   Triage Vital Signs: ED Triage Vitals  Encounter Vitals Group     BP 03/20/23 2109 132/87     Systolic BP Percentile --      Diastolic BP Percentile --      Pulse Rate 03/20/23 2109 99     Resp 03/20/23 2109 (!) 22     Temp 03/20/23 2109 97.9 F (36.6 C)     Temp Source 03/20/23 2109 Oral     SpO2 03/20/23 2109 100 %     Weight 03/20/23 2110 222 lb (100.7 kg)     Height 03/20/23 2110 5\' 10"  (1.778 m)     Head Circumference --      Peak Flow --      Pain Score 03/20/23 2109 4     Pain Loc --      Pain Education --      Exclude from Growth Chart --     Most recent vital signs: Vitals:   03/20/23 2109 03/21/23 0038  BP: 132/87 (!) 140/89  Pulse: 99 78  Resp: (!) 22 20  Temp: 97.9 F (36.6 C) 97.8 F (36.6 C)  SpO2: 100% 100%    General: Awake, no distress.  CV:  Good peripheral perfusion.  Resp:  Normal effort.  Abd:  No distention.  Other:  Pleasant gentleman in no acute distress with normal vital signs, skin  appears warm well-perfused, lungs clear, soft nontender abdomen and appears euvolemic.  No focal neurologic deficits including dysarthria facial asymmetry motor or sensory deficits.   ED Results / Procedures / Treatments   Labs (all labs ordered are listed, but only abnormal results are displayed) Labs Reviewed  CBC - Abnormal; Notable for the following components:      Result Value   WBC 13.8 (*)    All other components within normal limits  COMPREHENSIVE METABOLIC PANEL - Abnormal; Notable for the following components:   Glucose, Bld 115 (*)    Creatinine, Ser 1.72 (*)    Total Protein 8.2 (*)    AST 46 (*)    ALT 80 (*)    GFR, Estimated 44 (*)    All other components within normal limits  TROPONIN I (HIGH SENSITIVITY)  TROPONIN I (HIGH SENSITIVITY)     I ordered and reviewed the above labs they are notable for white blood cell count is mildly elevated 13.8  EKG  ED ECG REPORT I, Pilar Jarvis, the attending physician, personally viewed and  interpreted this ECG.   Date: 03/21/2023  EKG Time: 2109  Rate: 93  Rhythm: sinus  Axis: nl  Intervals:none  ST&T Change: no stemi    RADIOLOGY I independently reviewed and interpreted CT of the head see no obvious bleeding or midline shift I also reviewed radiologist's formal read.   PROCEDURES:  Critical Care performed: No  Procedures   MEDICATIONS ORDERED IN ED: Medications - No data to display  IMPRESSION / MDM / ASSESSMENT AND PLAN / ED COURSE  I reviewed the triage vital signs and the nursing notes.                                Patient's presentation is most consistent with acute presentation with potential threat to life or bodily function.  Differential diagnosis includes, but is not limited to, vasovagal syncope, viral gastroenteritis, ACS, dysrhythmia, stroke, seizure, electrolyte derangement   The patient is on the cardiac monitor to evaluate for evidence of arrhythmia and/or significant heart rate  changes.  MDM:    It seems like he had a vasovagal syncope episode in the setting of a GI illness.  Appears well now with no symptoms currently.  I considered dysrhythmia, ACS, but doubt given no chest pain and especially in light of normal-appearing EKG with serial troponins negative.  He did have nausea vomiting diarrhea so suspect some sort of GI illness but doubt surgical abdominal pathology given a soft benign abdominal exam.  He has a mild AKI so I encouraged fluid intake, given prescription for Zofran, he will discharge and follow-up with PMD.         FINAL CLINICAL IMPRESSION(S) / ED DIAGNOSES   Final diagnoses:  AKI (acute kidney injury) (HCC)  Syncope and collapse  Vasovagal syncope     Rx / DC Orders   ED Discharge Orders          Ordered    ondansetron (ZOFRAN) 4 MG tablet  Daily PRN        03/21/23 0239             Note:  This document was prepared using Dragon voice recognition software and may include unintentional dictation errors.    Pilar Jarvis, MD 03/21/23 [phone]

## 2023-03-23 ENCOUNTER — Encounter: Payer: Self-pay | Admitting: Nurse Practitioner

## 2023-03-23 ENCOUNTER — Ambulatory Visit (INDEPENDENT_AMBULATORY_CARE_PROVIDER_SITE_OTHER): Payer: BC Managed Care – PPO | Admitting: Nurse Practitioner

## 2023-03-23 VITALS — BP 122/76 | HR 64 | Temp 98.1°F | Ht 70.0 in | Wt 224.0 lb

## 2023-03-23 DIAGNOSIS — R42 Dizziness and giddiness: Secondary | ICD-10-CM | POA: Diagnosis not present

## 2023-03-23 DIAGNOSIS — Z09 Encounter for follow-up examination after completed treatment for conditions other than malignant neoplasm: Secondary | ICD-10-CM | POA: Insufficient documentation

## 2023-03-23 NOTE — Progress Notes (Signed)
Established Patient Office Visit  Subjective   Patient ID: Geoffrey Orozco, male    DOB: [birth year]  Age: 62 y.o. MRN: [id number]  Chief Complaint  Patient presents with   Follow-up    Pt complains of doing better but not back to normal self. States that he drinks a lot fluids.     HPI  Hospital follow-up: Patient was seen in the emergency department on 03/21/2019 for syncope and collapse.  Patient went out to mow the lawn came inside to eat dinner and had a flushed feeling.  He went to the restroom had some diarrhea nausea and vomiting during the episode he did syncopized.  It was witnessed by his wife after a brief loss of consciousness he was awake and alert per the ED note.  Patient underwent serial troponins which were negative a CMP which showed an increase in creatinine and liver functions.  He also had a decrease in GFR.  CBC showed mild leukocytosis.  He also had a CT scan of the head which was negative.  Patient was discharged and encouraged to increase oral intake and follow-up with primary care.  He is here today for follow-up  States that he is better than he was but not 100 percent. States that he still has episodes of wooziness. States that he drinks water all day all day. States that he did have a electrolyte drink. State sthat he is having some.     Review of Systems  Constitutional:  Negative for chills and fever.  Respiratory:  Negative for shortness of breath.   Cardiovascular:  Negative for chest pain.  Neurological:  Positive for dizziness. Negative for headaches.      Objective:     BP 122/76   Pulse 64   Temp 98.1 F (36.7 C) (Temporal)   Ht 5\' 10"  (1.778 m)   Wt 224 lb (101.6 kg)   SpO2 96%   BMI 32.14 kg/m  BP Readings from Last 3 Encounters:  03/23/23 122/76  03/21/23 (!) 140/89  01/18/23 (!) 163/92   Wt Readings from Last 3 Encounters:  03/23/23 224 lb (101.6 kg)  03/20/23 222 lb (100.7 kg)  01/18/23 227 lb (103 kg)      Physical  Exam Vitals and nursing note reviewed.  Constitutional:      Appearance: Normal appearance.  Cardiovascular:     Rate and Rhythm: Normal rate and regular rhythm.     Heart sounds: Normal heart sounds.  Pulmonary:     Effort: Pulmonary effort is normal.     Breath sounds: Normal breath sounds.  Abdominal:     General: Bowel sounds are normal. There is no distension.     Palpations: There is no mass.     Tenderness: There is abdominal tenderness in the epigastric area.     Hernia: No hernia is present.  Neurological:     Mental Status: He is alert.      No results found for any visits on 03/23/23.    The 10-year ASCVD risk score (Arnett DK, et al., 2019) is: 8.8%    Assessment & Plan:   Problem List Items Addressed This Visit       Other   Hospital discharge follow-up - Primary    Did review emergency department note labs and imaging.      Relevant Orders   Orthostatic vital signs   Lightheadedness    Likely secondary to volume depletion with AKI.  Patient's been pushing fluids pending labs today.  Orthostatics negative in office.  Patient still having AKI lab values we will either discontinue lisinopril or reduce the dose he does have the ability to monitor blood pressure at home.      Relevant Orders   Orthostatic vital signs   CBC   Basic metabolic panel    Return if symptoms worsen or fail to improve, for As scheduled .    Audria Nine, NP

## 2023-03-23 NOTE — Patient Instructions (Signed)
Nice to see you today I will be in touch with the labs once I have them Follow up if you do not continue to improve

## 2023-03-23 NOTE — Assessment & Plan Note (Signed)
Did review emergency department note labs and imaging.

## 2023-03-23 NOTE — Assessment & Plan Note (Signed)
Likely secondary to volume depletion with AKI.  Patient's been pushing fluids pending labs today.  Orthostatics negative in office.  Patient still having AKI lab values we will either discontinue lisinopril or reduce the dose he does have the ability to monitor blood pressure at home.

## 2023-03-24 LAB — CBC
HCT: 46.1 % (ref 39.0–52.0)
Hemoglobin: 15.4 g/dL (ref 13.0–17.0)
MCHC: 33.3 g/dL (ref 30.0–36.0)
MCV: 87.6 fL (ref 78.0–100.0)
Platelets: 273 10*3/uL (ref 150.0–400.0)
RBC: 5.26 Mil/uL (ref 4.22–5.81)
RDW: 13.6 % (ref 11.5–15.5)
WBC: 7.3 10*3/uL (ref 4.0–10.5)

## 2023-03-24 LAB — BASIC METABOLIC PANEL
BUN: 24 mg/dL — ABNORMAL HIGH (ref 6–23)
CO2: 27 meq/L (ref 19–32)
Calcium: 10.2 mg/dL (ref 8.4–10.5)
Chloride: 100 meq/L (ref 96–112)
Creatinine, Ser: 1.18 mg/dL (ref 0.40–1.50)
GFR: 65.76 mL/min (ref >60.00)
Glucose, Bld: 87 mg/dL (ref 70–99)
Potassium: 4.4 meq/L (ref 3.5–5.1)
Sodium: 137 meq/L (ref 135–145)

## 2023-05-04 ENCOUNTER — Ambulatory Visit: Payer: BC Managed Care – PPO | Admitting: Nurse Practitioner

## 2023-05-04 ENCOUNTER — Encounter: Payer: Self-pay | Admitting: Nurse Practitioner

## 2023-05-04 VITALS — BP 140/80 | HR 70 | Temp 97.7°F | Ht 70.0 in | Wt 225.2 lb

## 2023-05-04 DIAGNOSIS — I10 Essential (primary) hypertension: Secondary | ICD-10-CM | POA: Diagnosis not present

## 2023-05-04 DIAGNOSIS — H538 Other visual disturbances: Secondary | ICD-10-CM | POA: Diagnosis not present

## 2023-05-04 DIAGNOSIS — R209 Unspecified disturbances of skin sensation: Secondary | ICD-10-CM | POA: Diagnosis not present
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
## 2023-05-04 NOTE — Patient Instructions (Addendum)
Nice to see you today The shoulder sensation sound nerve related to me Continue checking your blood pressure at home. Send me the readings after you get a weeks worth of them

## 2023-05-04 NOTE — Assessment & Plan Note (Signed)
Currently maintained on Cardura 1 mg nightly and lisinopril 30 mg daily.  Blood pressure at goal he will check at home if he still at or above goal we will increase lisinopril to 40 mg.

## 2023-05-04 NOTE — Assessment & Plan Note (Signed)
Single occurrence that self resolved.  Patient's blood pressure was elevated when this happened.  Recent eye exam within the past 2 weeks that was normal does have a history of floaters and flashers but this was different no history of migraine headaches no pain after the blurred vision.  Neurological exam benign continue monitoring blood pressure at home if borderline or high readings consider increasing lisinopril to 40 mg

## 2023-05-04 NOTE — Assessment & Plan Note (Signed)
Patient experiencing some paresthesias secondary to positions.  He will see his chiropractor.  Offered to do cervical spine x-rays patient politely declined.  If it does not improve or becomes more frequent or worsens consider cervical spine x-ray.  Could also consider steroid burst.

## 2023-05-24 ENCOUNTER — Other Ambulatory Visit
Admission: RE | Admit: 2023-05-24 | Discharge: 2023-05-24 | Disposition: A | Discharge status: Home / Self Care | Payer: Self-pay | Source: Ambulatory Visit | Attending: Medical Genetics | Admitting: Medical Genetics

## 2023-05-24 ENCOUNTER — Other Ambulatory Visit: Payer: Self-pay | Admitting: Medical Genetics

## 2023-05-31 ENCOUNTER — Other Ambulatory Visit: Payer: Self-pay | Admitting: Medical Genetics

## 2023-06-06 LAB — GENECONNECT MOLECULAR SCREEN: Genetic Analysis Overall Interpretation: NEGATIVE

## 2023-06-14 ENCOUNTER — Other Ambulatory Visit: Payer: Self-pay

## 2023-08-08 IMAGING — DX DG KNEE COMPLETE 4+V*R*
4 series · 4 of 4 positions shown · non-contrast
Comparison: None Available.

CLINICAL DATA: Persistent medial right knee pain

EXAM:
RIGHT KNEE - COMPLETE 4+ VIEW

[knee ap]
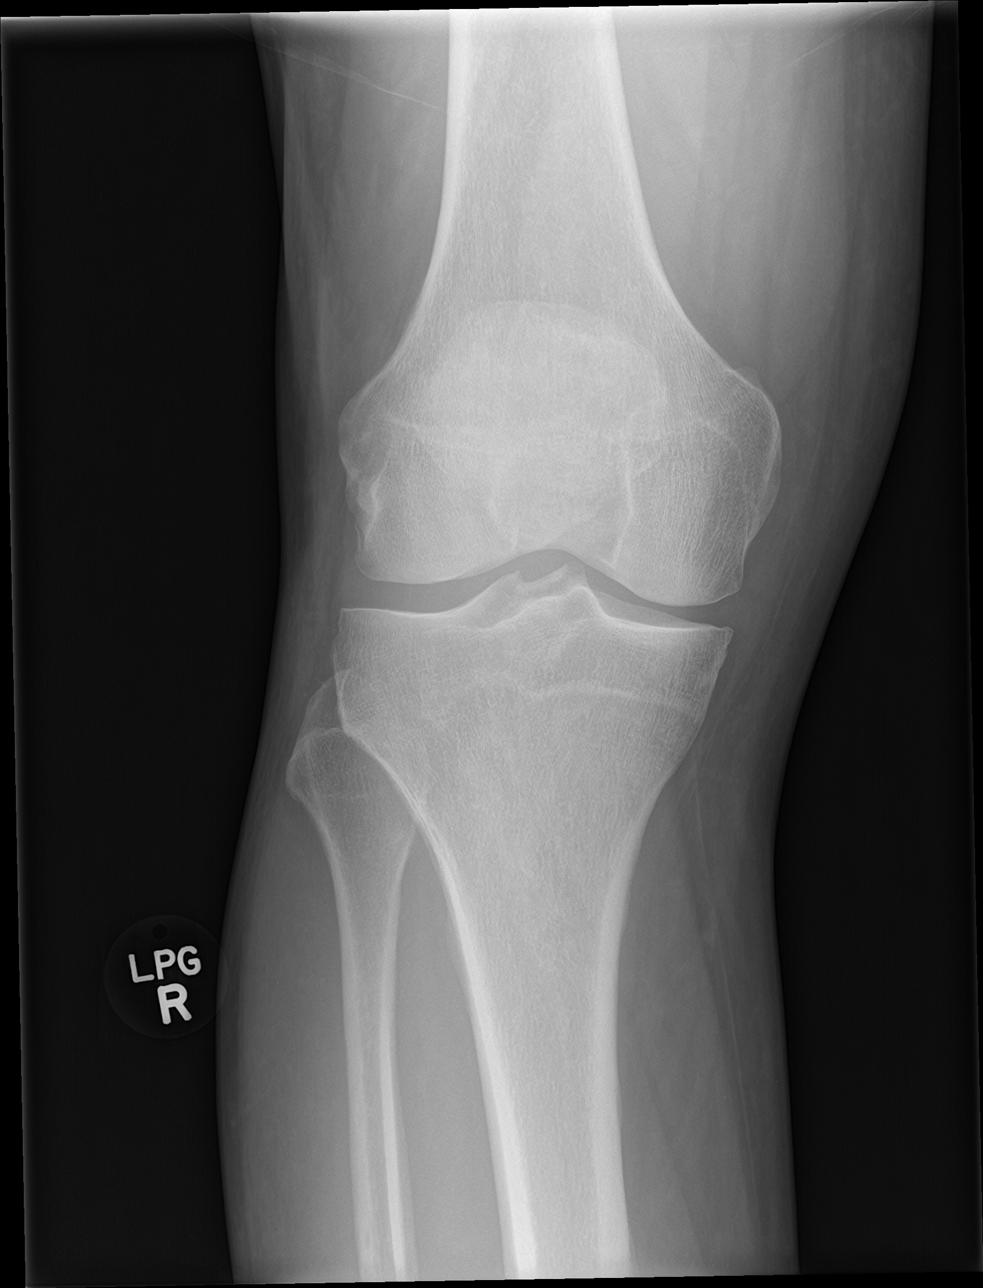

[knee obl (1 of 2)]
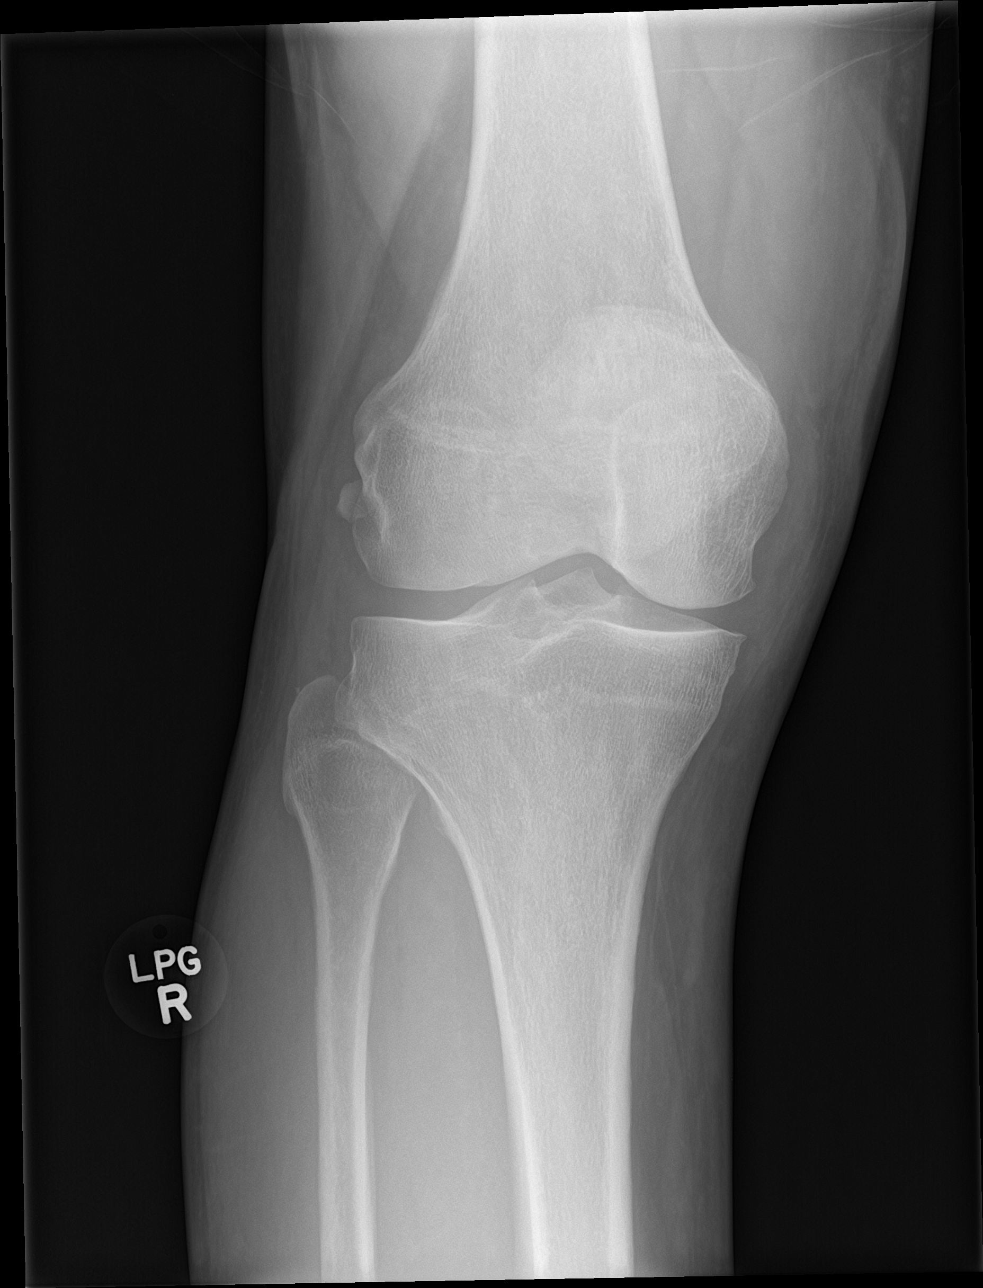

[knee obl (2 of 2)]
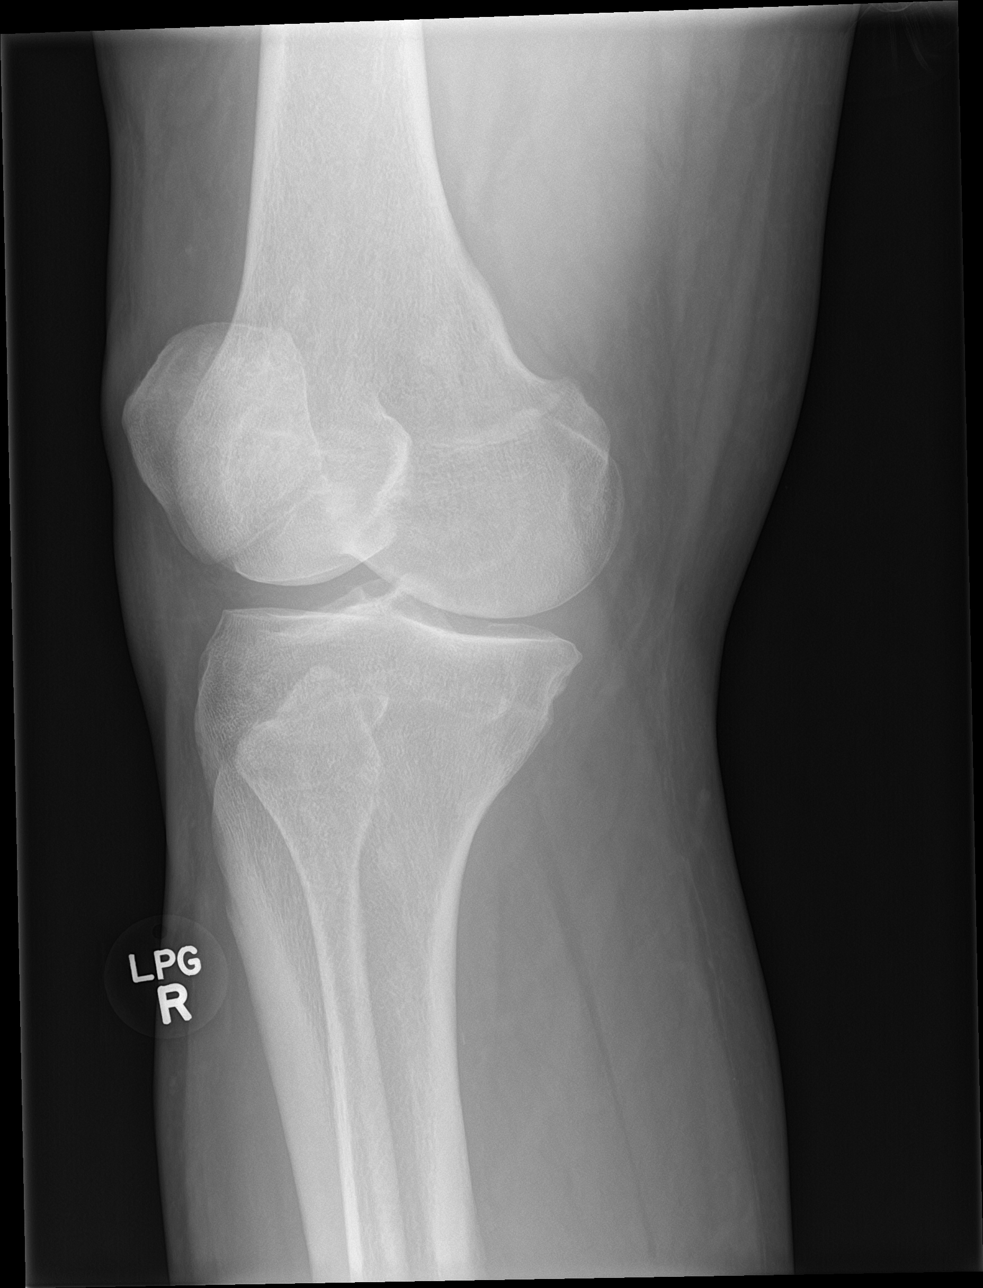

[knee lat]
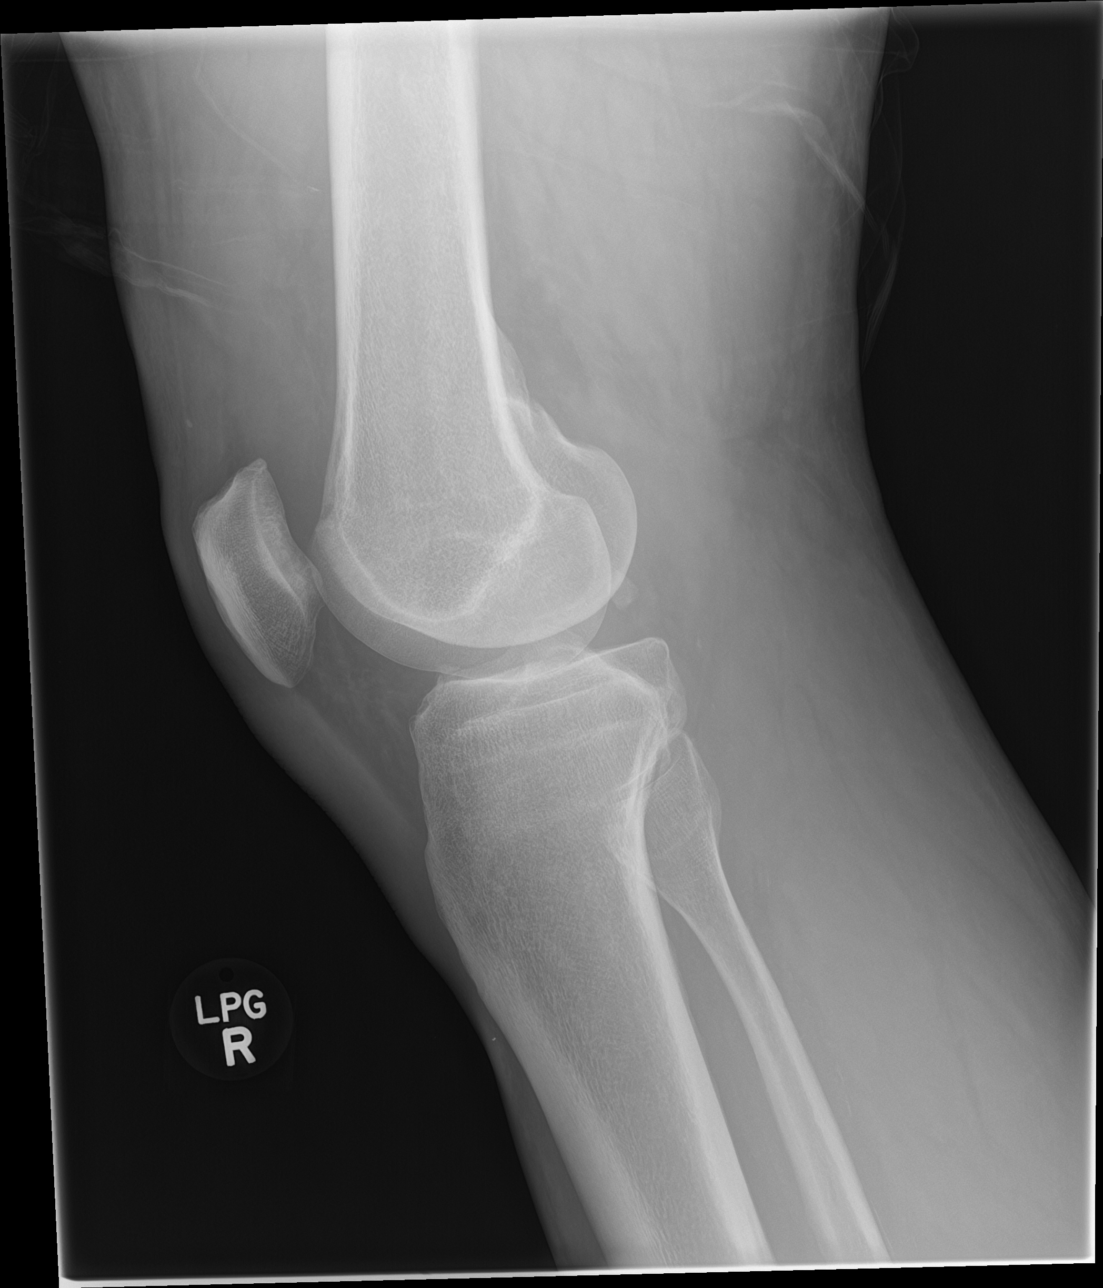

[4 of 4 positions shown; findings below may reference images not displayed]

FINDINGS: There is no acute fracture or dislocation. Alignment is normal.
There is mild medial compartment narrowing with minimal associated
osteophytosis. There is a small suprapatellar effusion.
IMPRESSION: 1. Mild medial compartment joint space narrowing with minimal
associated osteophytosis.
2. Small suprapatellar effusion.

## 2023-09-25 ENCOUNTER — Encounter: Payer: BC Managed Care – PPO | Admitting: Nurse Practitioner

## 2023-10-24 ENCOUNTER — Ambulatory Visit (INDEPENDENT_AMBULATORY_CARE_PROVIDER_SITE_OTHER): Admitting: Nurse Practitioner

## 2023-10-24 VITALS — BP 112/72 | HR 61 | Temp 98.0°F | Ht 70.0 in | Wt 221.0 lb

## 2023-10-24 DIAGNOSIS — R09A2 Foreign body sensation, throat: Secondary | ICD-10-CM | POA: Diagnosis not present

## 2023-10-24 DIAGNOSIS — K219 Gastro-esophageal reflux disease without esophagitis: Secondary | ICD-10-CM | POA: Insufficient documentation

## 2023-10-24 DIAGNOSIS — E041 Nontoxic single thyroid nodule: Secondary | ICD-10-CM | POA: Diagnosis not present

## 2023-10-24 DIAGNOSIS — R42 Dizziness and giddiness: Secondary | ICD-10-CM

## 2023-10-24 MED ORDER — PANTOPRAZOLE SODIUM 20 MG PO TBEC: 20.0000 mg | DELAYED_RELEASE_TABLET | Freq: Every day | ORAL | 0 refills | Status: DC

## 2023-10-24 MED ORDER — LISINOPRIL 20 MG PO TABS
20.0000 mg | ORAL_TABLET | Freq: Every day | ORAL | 0 refills | Status: DC
Start: 2023-10-24 — End: 2023-11-15

## 2023-10-24 NOTE — Assessment & Plan Note (Signed)
 Per patient report US  carotid showed thyroid  nodules. Dedicated thyroid  US  ordered today

## 2023-10-24 NOTE — Assessment & Plan Note (Signed)
 Will decrease the lisinopril to 20mg  daily. With close monitoring at home and close follow up in office in 2 weeks. Continue staying hydrated. I did review cardiology notes and their orthostatic vitals.

## 2023-10-24 NOTE — Assessment & Plan Note (Signed)
 Hx of the same. On pepcid  20mg  BID. Will add on protonix 20mg  daily for 30 days

## 2023-10-24 NOTE — Progress Notes (Signed)
 Acute Office Visit  Subjective:     Patient ID: Geoffrey Orozco, male    DOB: [birth year], 63 y.o.   MRN: [id number]  Chief Complaint  Patient presents with   Dizziness    Pt complains of dizziness ongoing for a month. Pt states of nodules on both sides of thyroid . Pt states BP has been low for him (105/69) . Pt states he is normal at 135/80. Pt complains of SOB and has trouble swallowing.      Patient is in today for multiple complaints   Dizziness: he was seen by cardiolog on 09/27/2023 for same and ordered labs and carotid US . States that with the US  showed thyroid  noduels and recommended a thyroid  US . States that when he bends over and stands up. State that sitting to standing. It is a light headedness.  States that he has been checking his Bp at home and getting the lowest of 105/69.  Statse 120/60 he is not as bad but still having it. States that he is drinking a loto with 8 glasses of water a day.  When he is activity he will do cocounut water.   Diffculty swallowing: he has been having trouble swallowing all foods, liquids and spit. States that it is all the time. States that he is doing the pepcid  that was prescribed from GI. He states that it feels like something is in his throat  Review of Systems  Constitutional:  Negative for chills and fever.  Respiratory:  Positive for shortness of breath.   Cardiovascular:  Negative for chest pain.  Neurological:  Positive for dizziness. Negative for headaches.        Objective:    BP 112/72   Pulse 61   Temp 98 F (36.7 C) (Oral)   Ht 5\' 10"  (1.778 m)   Wt 221 lb (100.2 kg)   SpO2 98%   BMI 31.71 kg/m  BP Readings from Last 3 Encounters:  10/24/23 112/72  05/04/23 (!) 140/80  03/23/23 122/76   Wt Readings from Last 3 Encounters:  10/24/23 221 lb (100.2 kg)  05/04/23 225 lb 3.2 oz (102.2 kg)  03/23/23 224 lb (101.6 kg)   SpO2 Readings from Last 3 Encounters:  10/24/23 98%  05/04/23 99%  03/23/23 96%       Physical Exam Vitals and nursing note reviewed.  Constitutional:      Appearance: Normal appearance.  HENT:     Right Ear: Tympanic membrane, ear canal and external ear normal.     Left Ear: Tympanic membrane, ear canal and external ear normal.     Mouth/Throat:     Mouth: Mucous membranes are moist.     Pharynx: Oropharynx is clear.     Comments: UVP Eyes:     Extraocular Movements: Extraocular movements intact.     Pupils: Pupils are equal, round, and reactive to light.  Neck:     Vascular: No carotid bruit.  Cardiovascular:     Rate and Rhythm: Normal rate and regular rhythm.     Heart sounds: Normal heart sounds.  Pulmonary:     Effort: Pulmonary effort is normal.     Breath sounds: Normal breath sounds.  Lymphadenopathy:     Cervical: No cervical adenopathy.  Neurological:     General: No focal deficit present.     Mental Status: He is alert.     Deep Tendon Reflexes:     Reflex Scores:      Bicep reflexes are 2+ on the right  side and 2+ on the left side.      Patellar reflexes are 2+ on the right side and 2+ on the left side.    Comments: Bilateral upper and lower extremity strength 5/5     No results found for any visits on 10/24/23.      Assessment & Plan:   Problem List Items Addressed This Visit       Digestive   Gastroesophageal reflux disease   Hx of the same. On pepcid  20mg  BID. Will add on protonix 20mg  daily for 30 days      Relevant Medications   pantoprazole (PROTONIX) 20 MG tablet     Endocrine   Thyroid  nodule   Per patient report US  carotid showed thyroid  nodules. Dedicated thyroid  US  ordered today       Relevant Orders   US  THYROID      Other   Orthostatic dizziness   Will decrease the lisinopril to 20mg  daily. With close monitoring at home and close follow up in office in 2 weeks. Continue staying hydrated. I did review cardiology notes and their orthostatic vitals.       Relevant Medications   lisinopril (ZESTRIL) 20 MG  tablet   Globus sensation - Primary    Meds ordered this encounter  Medications   pantoprazole (PROTONIX) 20 MG tablet    Sig: Take 1 tablet (20 mg total) by mouth daily.    Dispense:  30 tablet    Refill:  0    Supervising Provider:   Deri Fleet A [[id number]]   lisinopril (ZESTRIL) 20 MG tablet    Sig: Take 1 tablet (20 mg total) by mouth daily.    Dispense:  30 tablet    Refill:  0    Supervising Provider:   Deri Fleet A [[id number]]    Return in about 2 weeks (around 11/07/2023) for BP recheck.  Margarie Shay, NP

## 2023-10-24 NOTE — Patient Instructions (Signed)
 Nice to see you today Take the lisinopril 20mg  instead of the 30mg  Follow up with me in 2 weeks. Sooner if you need me   Call and schedule the US  at this   [facility] - [address] [place] , [place] [address] [phone]

## 2023-10-31 ENCOUNTER — Ambulatory Visit
Admission: RE | Admit: 2023-10-31 | Discharge: 2023-10-31 | Disposition: A | Discharge status: Home / Self Care | Source: Ambulatory Visit | Attending: Nurse Practitioner | Admitting: Nurse Practitioner

## 2023-10-31 DIAGNOSIS — E041 Nontoxic single thyroid nodule: Secondary | ICD-10-CM | POA: Insufficient documentation

## 2023-11-02 ENCOUNTER — Encounter: Payer: Self-pay | Admitting: Nurse Practitioner

## 2023-11-08 ENCOUNTER — Other Ambulatory Visit: Payer: Self-pay | Admitting: Nurse Practitioner

## 2023-11-08 ENCOUNTER — Ambulatory Visit (INDEPENDENT_AMBULATORY_CARE_PROVIDER_SITE_OTHER): Admitting: Nurse Practitioner

## 2023-11-08 VITALS — BP 136/74 | HR 69 | Temp 97.7°F | Ht 70.0 in | Wt 222.2 lb

## 2023-11-08 DIAGNOSIS — R0982 Postnasal drip: Secondary | ICD-10-CM | POA: Insufficient documentation

## 2023-11-08 DIAGNOSIS — R42 Dizziness and giddiness: Secondary | ICD-10-CM

## 2023-11-08 DIAGNOSIS — J029 Acute pharyngitis, unspecified: Secondary | ICD-10-CM | POA: Insufficient documentation

## 2023-11-08 DIAGNOSIS — I10 Essential (primary) hypertension: Secondary | ICD-10-CM | POA: Diagnosis not present

## 2023-11-08 MED ORDER — PANTOPRAZOLE SODIUM 20 MG PO TBEC: 20.0000 mg | DELAYED_RELEASE_TABLET | Freq: Every day | ORAL | 0 refills | Status: DC

## 2023-11-08 NOTE — Progress Notes (Signed)
 Established Patient Office Visit  Subjective   Patient ID: Geoffrey Orozco, male    DOB: Feb 15, 1961  Age: 63 y.o. MRN: [id number]  Chief Complaint  Patient presents with   Blood Pressure Check    Pt complains of bp fluctuating. Goes from 150/70s to 130/70 to 118 for systolic. States its alleviating dizziness.     HPI  HTN/orthostatic dizziness: Patient was seen by me on 10/24/2023 with clinical orthostatic dizziness.  Patient's lisinopril  was changed from 30 mg to 20 mg and he was encouraged to check blood pressure at home.  He is here for follow-up ,  States that his blood pressure is back up to where it was previously. States that the dizziness has improved. He is checking it twice a dayand getting readings of 143/88 141/80, 118/64, 125/68, 150/77, 108/57  Globus sensation: Patient concern for A globus sensation with a history of reflux.  Patient been on Pepcid  20 mg twice daily we did add on Protonix  20 mg for 30 days.  Also get ultrasound of the thyroid  per patient request as ultrasound of carotids or thyroid  nodules.  It did show a 1.3 cm nodule in the midpole of the left thyroid  does not meet criteria for biopsy or dedicated follow-up. States that the gowling of the stomach has improved but not resolved.   Sore throat: states that his wife was complaining of a sore thorat yesterday. She has an appt with UC today. She does work with the [employer]. States that this morning he has been feeling a little scratch on the throat.    Review of Systems  Constitutional:  Positive for malaise/fatigue. Negative for chills and fever.  HENT:  Positive for congestion, sinus pain and sore throat. Negative for ear discharge and ear pain.   Respiratory:  Positive for cough. Negative for sputum production and shortness of breath.   Cardiovascular:  Negative for chest pain.  Gastrointestinal:  Negative for abdominal pain, diarrhea, heartburn, nausea and vomiting.  Musculoskeletal:  Negative for joint  pain and neck pain.  Neurological:  Positive for dizziness. Negative for headaches.      Objective:     BP 136/74   Pulse 69   Temp 97.7 F (36.5 C) (Oral)   Ht 5\' 10"  (1.778 m)   Wt 222 lb 3.2 oz (100.8 kg)   SpO2 97%   BMI 31.88 kg/m  BP Readings from Last 3 Encounters:  11/08/23 136/74  10/24/23 112/72  05/04/23 (!) 140/80   Wt Readings from Last 3 Encounters:  11/08/23 222 lb 3.2 oz (100.8 kg)  10/24/23 221 lb (100.2 kg)  05/04/23 225 lb 3.2 oz (102.2 kg)   SpO2 Readings from Last 3 Encounters:  11/08/23 97%  10/24/23 98%  05/04/23 99%      Physical Exam Vitals and nursing note reviewed.  Constitutional:      Appearance: Normal appearance.  HENT:     Right Ear: Tympanic membrane, ear canal and external ear normal.     Left Ear: Tympanic membrane, ear canal and external ear normal.     Ears:     Comments: Clear fluid behind bilateral TM  Right TM bulging     Mouth/Throat:     Mouth: Mucous membranes are moist.     Pharynx: Oropharynx is clear.  Cardiovascular:     Rate and Rhythm: Normal rate and regular rhythm.     Heart sounds: Normal heart sounds.  Pulmonary:     Effort: Pulmonary effort is normal.  Breath sounds: Normal breath sounds.  Lymphadenopathy:     Cervical: No cervical adenopathy.  Neurological:     Mental Status: He is alert.      No results found for any visits on 11/08/23.    The 10-year ASCVD risk score (Arnett DK, et al., 2019) is: 10.6%    Assessment & Plan:   Problem List Items Addressed This Visit       Cardiovascular and Mediastinum   HTN (hypertension)   Patient currently maintained on lisinopril  20 mg daily along with Cardura 1 mg.  Patient's dizziness has improved.  Patient's blood pressure is still labile but generally well-controlled.  Blood pressure well-controlled in office today.  We will continue lisinopril  20 mg and Cardura 1 mg daily.  Continue checking blood pressure at home        Other    Orthostatic dizziness   Has improved with lisinopril  reduction from 30 mg to 20 mg.  Continue medication as prescribed.  Encouraged adequate hydration especially outside in the heat doing activities      PND (post-nasal drip)   Flu and COVID test negative in office.  Patient will use Flonase for the next week and continue on second-generation antihistamine.      Sore throat - Primary   Strep and COVID test in office.  Patient can use over-the-counter analgesics as needed along with drinking plenty of fluid       Return in about 3 months (around 02/08/2024) for BP recheck/ GERD recheck .    Margarie Shay, NP

## 2023-11-08 NOTE — Patient Instructions (Signed)
 Nice to see you today Start using the Flonase over the next week. It can cause nosebleeds. Continue using the zyrtec  Your strep and coivd test in office were both negative We will continue the Protonix  for the next 2 months Continue the lisinopril  20 and continue monitoring blood pressure at home

## 2023-11-08 NOTE — Assessment & Plan Note (Signed)
 Has improved with lisinopril  reduction from 30 mg to 20 mg.  Continue medication as prescribed.  Encouraged adequate hydration especially outside in the heat doing activities

## 2023-11-08 NOTE — Assessment & Plan Note (Signed)
 Flu and COVID test negative in office.  Patient will use Flonase for the next week and continue on second-generation antihistamine.

## 2023-11-08 NOTE — Assessment & Plan Note (Signed)
 Strep and COVID test in office.  Patient can use over-the-counter analgesics as needed along with drinking plenty of fluid

## 2023-11-08 NOTE — Assessment & Plan Note (Signed)
 Patient currently maintained on lisinopril  20 mg daily along with Cardura 1 mg.  Patient's dizziness has improved.  Patient's blood pressure is still labile but generally well-controlled.  Blood pressure well-controlled in office today.  We will continue lisinopril  20 mg and Cardura 1 mg daily.  Continue checking blood pressure at home

## 2023-11-15 ENCOUNTER — Other Ambulatory Visit: Payer: Self-pay | Admitting: Nurse Practitioner

## 2023-11-15 DIAGNOSIS — R42 Dizziness and giddiness: Secondary | ICD-10-CM

## 2023-11-25 ENCOUNTER — Other Ambulatory Visit: Payer: Self-pay | Admitting: Nurse Practitioner

## 2023-12-11 ENCOUNTER — Other Ambulatory Visit: Payer: Self-pay | Admitting: Physician Assistant

## 2023-12-11 DIAGNOSIS — K2100 Gastro-esophageal reflux disease with esophagitis, without bleeding: Secondary | ICD-10-CM

## 2023-12-20 ENCOUNTER — Other Ambulatory Visit: Payer: Self-pay | Admitting: Nurse Practitioner

## 2024-01-04 ENCOUNTER — Other Ambulatory Visit: Payer: Self-pay | Admitting: Nurse Practitioner

## 2024-02-08 ENCOUNTER — Ambulatory Visit (INDEPENDENT_AMBULATORY_CARE_PROVIDER_SITE_OTHER): Admitting: Nurse Practitioner

## 2024-02-08 ENCOUNTER — Other Ambulatory Visit: Payer: Self-pay | Admitting: Nurse Practitioner

## 2024-02-08 VITALS — BP 134/72 | HR 63 | Temp 98.1°F | Ht 70.0 in | Wt 221.4 lb

## 2024-02-08 DIAGNOSIS — K2100 Gastro-esophageal reflux disease with esophagitis, without bleeding: Secondary | ICD-10-CM

## 2024-02-08 DIAGNOSIS — M79671 Pain in right foot: Secondary | ICD-10-CM | POA: Insufficient documentation

## 2024-02-08 DIAGNOSIS — M545 Low back pain, unspecified: Secondary | ICD-10-CM | POA: Diagnosis not present

## 2024-02-08 DIAGNOSIS — I10 Essential (primary) hypertension: Secondary | ICD-10-CM

## 2024-02-08 DIAGNOSIS — M79672 Pain in left foot: Secondary | ICD-10-CM

## 2024-02-08 DIAGNOSIS — Z9109 Other allergy status, other than to drugs and biological substances: Secondary | ICD-10-CM | POA: Diagnosis not present

## 2024-02-08 LAB — POCT URINALYSIS DIPSTICK
Bilirubin, UA: NEGATIVE
Blood, UA: NEGATIVE
Glucose, UA: NEGATIVE
Ketones, UA: NEGATIVE
Leukocytes, UA: NEGATIVE
Nitrite, UA: NEGATIVE
Protein, UA: NEGATIVE
Spec Grav, UA: 1.02 (ref 1.010–1.025)
Urobilinogen, UA: 0.2 U/dL
pH, UA: 5 (ref 5.0–8.0)

## 2024-02-08 MED ORDER — CETIRIZINE HCL 10 MG PO TABS: 10.0000 mg | ORAL_TABLET | Freq: Every day | ORAL | 1 refills | Status: AC

## 2024-02-08 MED ORDER — PANTOPRAZOLE SODIUM 20 MG PO TBEC: 20.0000 mg | DELAYED_RELEASE_TABLET | Freq: Every day | ORAL | 0 refills | Status: DC

## 2024-02-08 NOTE — Assessment & Plan Note (Signed)
 History of the same.  Patient did have endoscopy that showed esophagitis.  Recommended PPI for several months.  Patient trialed Protonix  20 mg daily with good relief.  Insurance would not refill and patient tried esomeprazole 10 mg packets without relief.  Patient currently doing Pepcid  twice daily along with Tums as needed will retrial Protonix  20 mg daily for 3 months

## 2024-02-08 NOTE — Assessment & Plan Note (Signed)
 Patient can use over-the-counter antiinflammatories as directed for 5 to 7 days with food follow-up with sports medicine if no improvement

## 2024-02-08 NOTE — Progress Notes (Signed)
 Established Patient Office Visit  Subjective   Patient ID: Geoffrey Orozco, male    DOB: May 06, 1961  Age: 63 y.o. MRN: [id number]  Chief Complaint  Patient presents with   Follow-up    Pt complains that BP has been good. Thalia is has not improved but states that omeprazole  worked well and would like a refill.     HPI  GERD: The patient was last seen by me on 10/24/2023 for GERD and globus sensation.  Patient with a history of the same and was maintained on Pepcid  20 mg twice daily.  We did add on Protonix  20 mg daily for 30 days.  This was not covered by insurance so he started Nexium 10 mg daily states that the powder that did not help States that he was on the protonix  and it did well. States that he did well on it and continued the pepcid . States that he fells the growling went away   HTN: currenlty on lisinopril  20mg  daily. States that he has been checking it at home. States that he did go low readings of 106 and then came back to normal. No dizziness with the lower reading   Back pain: started over the past couple of weeks that has not improved. Stats that he went to bed on Sunday and it took a minute to relax. States that he did open his eyes and felt dizzy. States that it happened the one. Does have numbness on the right posterio thogh that has been long standing. He has tried stretching that has not helped   Bilateral ankle pain: left is worse than right. The right will hurt with eversoin movememnts and walking it will swell and reqire him to ice it. The left hurts with wlaking. He is wearing sandals but it hurts also with supportive shoes. Has a hx of tendonitis and booting that required corticosteroid injections    Review of Systems  Constitutional:  Negative for chills and fever.  Respiratory:  Negative for shortness of breath.   Cardiovascular:  Negative for chest pain.  Musculoskeletal:  Positive for back pain.  Neurological:  Positive for dizziness. Negative for  headaches.      Objective:     BP 134/72   Pulse 63   Temp 98.1 F (36.7 C) (Oral)   Ht 5' 10 (1.778 m)   Wt 221 lb 6.4 oz (100.4 kg)   SpO2 95%   BMI 31.77 kg/m  BP Readings from Last 3 Encounters:  02/08/24 134/72  11/08/23 136/74  10/24/23 112/72   Wt Readings from Last 3 Encounters:  02/08/24 221 lb 6.4 oz (100.4 kg)  11/08/23 222 lb 3.2 oz (100.8 kg)  10/24/23 221 lb (100.2 kg)   SpO2 Readings from Last 3 Encounters:  02/08/24 95%  11/08/23 97%  10/24/23 98%      Physical Exam Vitals and nursing note reviewed.  Constitutional:      Appearance: Normal appearance.  Cardiovascular:     Rate and Rhythm: Normal rate and regular rhythm.     Heart sounds: Normal heart sounds.  Pulmonary:     Effort: Pulmonary effort is normal.     Breath sounds: Normal breath sounds.  Abdominal:     Tenderness: There is no abdominal tenderness. There is no right CVA tenderness or left CVA tenderness.  Musculoskeletal:        General: No tenderness.     Thoracic back: No tenderness or bony tenderness.     Lumbar back: No tenderness  or bony tenderness. Negative right straight leg raise test and negative left straight leg raise test.       Back:       Feet:     Comments: Pain with lateral slides  None with later flexion or lateral rotation  Neurological:     Mental Status: He is alert.      Results for orders placed or performed in visit on 02/08/24  POCT urinalysis dipstick  Result Value Ref Range   Color, UA yellow    Clarity, UA clear    Glucose, UA Negative Negative   Bilirubin, UA neg    Ketones, UA neg    Spec Grav, UA 1.020 1.010 - 1.025   Blood, UA neg    pH, UA 5.0 5.0 - 8.0   Protein, UA Negative Negative   Urobilinogen, UA 0.2 0.2 or 1.0 E.U./dL   Nitrite, UA neg    Leukocytes, UA Negative Negative   Appearance     Odor        The ASCVD Risk score (Arnett DK, et al., 2019) failed to calculate for the following reasons:   The valid total  cholesterol range is 130 to 320 mg/dL    Assessment & Plan:   Problem List Items Addressed This Visit       Cardiovascular and Mediastinum   HTN (hypertension)   Patient currently maintained on lisinopril  20 mg daily and Cardura 1 mg nightly.  Blood pressure well-controlled.  Patient does not have any lightheadedness or dizziness.  Continue medication as prescribed        Digestive   Gastroesophageal reflux disease   History of the same.  Patient did have endoscopy that showed esophagitis.  Recommended PPI for several months.  Patient trialed Protonix  20 mg daily with good relief.  Insurance would not refill and patient tried esomeprazole 10 mg packets without relief.  Patient currently doing Pepcid  twice daily along with Tums as needed will retrial Protonix  20 mg daily for 3 months      Relevant Medications   pantoprazole  (PROTONIX ) 20 MG tablet     Other   Bilateral foot pain - Primary   Patient can use over-the-counter antiinflammatories as directed for 5 to 7 days with food follow-up with sports medicine if no improvement      Acute bilateral low back pain without sciatica   Offered patient muscle relaxer.  He politely declined at this juncture states he will try Aleve to see if this helps as a lot of times it does.  Patient decides he wants a muscle relaxer planning to get Flexeril 5 mg twice daily as needed.      Relevant Orders   POCT urinalysis dipstick (Completed)   Environmental allergies   Patient requested prescription of Zyrtec .  Prescription sent to pharmacy       Return in about 3 months (around 05/10/2024) for CPE and Labs.    Adina Crandall, NP

## 2024-02-08 NOTE — Assessment & Plan Note (Signed)
 Patient requested prescription of Zyrtec .  Prescription sent to pharmacy

## 2024-02-08 NOTE — Assessment & Plan Note (Signed)
 Offered patient muscle relaxer.  He politely declined at this juncture states he will try Aleve to see if this helps as a lot of times it does.  Patient decides he wants a muscle relaxer planning to get Flexeril 5 mg twice daily as needed.

## 2024-02-08 NOTE — Assessment & Plan Note (Signed)
 Patient currently maintained on lisinopril  20 mg daily and Cardura 1 mg nightly.  Blood pressure well-controlled.  Patient does not have any lightheadedness or dizziness.  Continue medication as prescribed

## 2024-02-09 ENCOUNTER — Telehealth: Payer: Self-pay

## 2024-02-09 NOTE — Telephone Encounter (Signed)
 Called pt.  Advised pt to use [employer] for discounted price for pantoprazole .  Pt verbalized understanding and will call if any concerns or questions.

## 2024-02-09 NOTE — Telephone Encounter (Signed)
 Copied from CRM #[id number]. Topic: Clinical - Prescription Issue >> Feb 09, 2024  1:41 PM Harlene ORN wrote: Reason for CRM: Patient's insurance is not covering his Pantoprazole  because they only cover the 90 day supply for the year. Please advise.

## 2024-02-09 NOTE — Telephone Encounter (Signed)
 With good RX he can get the 90 day supply for $37.11. can we see if that is doable for him

## 2024-05-15 ENCOUNTER — Ambulatory Visit: Admitting: Nurse Practitioner

## 2024-05-16 ENCOUNTER — Ambulatory Visit (INDEPENDENT_AMBULATORY_CARE_PROVIDER_SITE_OTHER): Admitting: Family Medicine

## 2024-05-16 ENCOUNTER — Ambulatory Visit: Payer: Self-pay

## 2024-05-16 VITALS — BP 155/90 | HR 71 | Ht 70.0 in | Wt 220.9 lb

## 2024-05-16 DIAGNOSIS — J029 Acute pharyngitis, unspecified: Secondary | ICD-10-CM

## 2024-05-16 DIAGNOSIS — J40 Bronchitis, not specified as acute or chronic: Secondary | ICD-10-CM

## 2024-05-16 DIAGNOSIS — Z20818 Contact with and (suspected) exposure to other bacterial communicable diseases: Secondary | ICD-10-CM | POA: Diagnosis not present

## 2024-05-16 LAB — POCT RAPID STREP A (OFFICE): Rapid Strep A Screen: NEGATIVE

## 2024-05-16 MED ORDER — PREDNISONE 20 MG PO TABS: 40.0000 mg | ORAL_TABLET | Freq: Every day | ORAL | 0 refills | Status: AC

## 2024-05-16 MED ORDER — BENZONATATE 200 MG PO CAPS: 200.0000 mg | ORAL_CAPSULE | Freq: Two times a day (BID) | ORAL | 0 refills | Status: DC | PRN

## 2024-05-16 NOTE — Telephone Encounter (Signed)
 Next Appt With Family Medicine Stoney Eva, MD) 05/16/2024 at 1:00 PM

## 2024-05-16 NOTE — Telephone Encounter (Signed)
 FYI Only or Action Required?: FYI only for provider: appointment scheduled on 05/16/24.  Patient was last seen in primary care on 02/08/2024 by Wendee Lynwood HERO, NP.  Called Nurse Triage reporting Sore Throat.  Symptoms began yesterday.  Interventions attempted: Nothing.  Symptoms are: unchanged.  Triage Disposition: See Physician Within 24 Hours  Patient/caregiver understands and will follow disposition?: Yes   Copied from CRM #[id number]. Topic: Clinical - Red Word Triage >> May 16, 2024 11:16 AM Alfonso HERO wrote: Patient has sore throat thinking it may be strep since he has been in contact with someone with it Reason for Disposition  SEVERE throat pain (e.g., excruciating)  Answer Assessment - Initial Assessment Questions No available appts with pcp, scheduled with alternative provider, 05/16/24. Advised call back if symptoms worsen.  1. ONSET: When did the throat start hurting? (Hours or days ago)      Week ago; cough productive, HA Strep exposure from family 2. SEVERITY: How bad is the sore throat? (Scale 1-10; mild, moderate or severe)     10/10 intermittently; no difficulty swallowing or drinking, denies drooling 3. STREP EXPOSURE: Has there been any exposure to strep within the past week? If Yes, ask: What type of contact occurred?      yes 4.  VIRAL SYMPTOMS: Are there any symptoms of a cold, such as a runny nose, cough, hoarse voice or red eyes?      Yellow phlegm, runny nose, cough 5. FEVER: Do you have a fever? If Yes, ask: What is your temperature, how was it measured, and when did it start?     Denies fever, chills, n/v 6. PUS ON THE TONSILS: Is there pus on the tonsils in the back of your throat?     no 7. OTHER SYMPTOMS: Do you have any other symptoms? (e.g., difficulty breathing, headache, rash)     Denies dizziness, chest pain Mild diff breathing intermittently  Protocols used: Sore Throat-A-AH

## 2024-05-16 NOTE — Progress Notes (Signed)
 Acute visit   Patient: Geoffrey Orozco   DOB: 12-21-1960   63 y.o. Male  MRN: [id number] PCP: Wendee Lynwood HERO, NP   Chief Complaint  Patient presents with   Acute Visit    sore throat/ rn trige today // Patient has sore throat thinking it may be strep since he has been in contact with someone with it.   Sore Throat    Sore throat X 1 week ago associated with productive cough and headache. He reports strep exposure in family. Reports severity as 10/10 intermittently; no difficulty swallowing or drinking, denies drooling. Reports viral symptoms of yellow phlegm , runny nose and cough. Denies fever, chills, nausea and vomiting. Patient reports symptoms worsening and that his DIL and grandchildren tested positive for strep today     Subjective    Discussed the use of AI scribe software for clinical note transcription with the patient, who gave verbal consent to proceed.  History of Present Illness   Geoffrey Orozco is a 63 year old male who presents with a sore throat and cough.  He has had a sore throat for about a week, which is intermittent and severe, causing difficulty swallowing saliva. A persistent cough has been present for the same duration, producing yellow sputum. Each coughing episode triggers a pounding headache. He experiences shortness of breath with minimal exertion, such as taking a few steps. He denies fever and has no history of smoking or lung disease.  His symptoms began after visiting family in [place] , where he was exposed to illness. His daughter-in-law tested positive for strep throat, and his wife, who has similar symptoms, was diagnosed with conjunctivitis.  He has a history of quadruple bypass surgery and stent placement. He sleeps in a recliner due to breathing difficulties when lying down and finds wheezing bothersome. He uses tea with honey for his cough and is open to prescription cough medicine, though it is only somewhat effective.         Review of Systems  Objective    BP (!) 155/90 (BP Location: Left Arm, Patient Position: Sitting, Cuff Size: Normal)   Pulse 71   Ht 5' 10 (1.778 m)   Wt 220 lb 14.4 oz (100.2 kg)   SpO2 100%   BMI 31.70 kg/m   Physical Exam Constitutional:      General: He is not in acute distress.    Appearance: Normal appearance. He is not diaphoretic.  HENT:     Head: Normocephalic.     Right Ear: Tympanic membrane and ear canal normal.     Left Ear: Tympanic membrane and ear canal normal.     Nose: No congestion or rhinorrhea.     Mouth/Throat:     Mouth: Mucous membranes are moist.     Pharynx: Posterior oropharyngeal erythema present. No pharyngeal swelling or oropharyngeal exudate.  Eyes:     Conjunctiva/sclera: Conjunctivae normal.  Cardiovascular:     Rate and Rhythm: Normal rate and regular rhythm.     Heart sounds: Normal heart sounds.  Pulmonary:     Effort: Pulmonary effort is normal. No respiratory distress.     Breath sounds: Wheezing and rhonchi present.  Musculoskeletal:     Cervical back: Neck supple.  Lymphadenopathy:     Cervical: No cervical adenopathy.  Neurological:     Mental Status: He is alert and oriented to person, place, and time. Mental status is at baseline.       Results for orders  placed or performed in visit on 05/16/24  POCT rapid strep A  Result Value Ref Range   Rapid Strep A Screen Negative Negative    Assessment & Plan     Problem List Items Addressed This Visit       Other   Sore throat   Relevant Orders   Culture, Group A Strep   POCT rapid strep A (Completed)   Other Visit Diagnoses       Bronchitis    -  Primary     Exposure to strep throat       Relevant Orders   Culture, Group A Strep   POCT rapid strep A (Completed)           Bronchitis with associated cough and wheezing Bronchitis likely viral in etiology, given recent exposure to grandchildren with similar symptoms. Symptoms include cough with yellow  sputum, wheezing, and shortness of breath. No smoking or lung disease history. Wheezing and cough likely contributing to sore throat. Prednisone  chosen to reduce inflammation and alleviate symptoms. Honey recommended as it outperforms prescription and over-the-counter cough medicines. - Prescribed prednisone  40 mg daily for one week, to be taken in the morning with food. - Prescribed Tessalon for cough management. - Advised use of honey for cough relief.  Acute pharyngitis Sore throat present for about a week, intermittent and severe, with difficulty swallowing. No white plaques observed in the throat, reducing suspicion for streptococcal pharyngitis. Strep test negative, but culture will be sent due to exposure history. Culture will help confirm diagnosis if initial test is negative. - Sent throat culture to confirm streptococcal pharyngitis. - If culture is positive, will initiate antibiotic treatment for strep throat.        Meds ordered this encounter  Medications   predniSONE  (DELTASONE ) 20 MG tablet    Sig: Take 2 tablets (40 mg total) by mouth daily with breakfast for 7 days.    Dispense:  14 tablet    Refill:  0   benzonatate (TESSALON) 200 MG capsule    Sig: Take 1 capsule (200 mg total) by mouth 2 (two) times daily as needed for cough.    Dispense:  20 capsule    Refill:  0     Return if symptoms worsen or fail to improve.      Jon Eva, MD  [facility] [phone] (phone) [phone] (fax)  [facility]

## 2024-05-16 NOTE — Telephone Encounter (Signed)
 noted

## 2024-05-19 LAB — CULTURE, GROUP A STREP

## 2024-05-21 NOTE — Progress Notes (Signed)
 "    Geoffrey Rufo T. Muslima Toppins, MD, CAQ Sports Medicine [facility] [address] [place], [address]  Phone: [phone]  FAX: [phone]  Geoffrey Orozco - 63 y.o. male  MRN [id number]  Date of Birth: [birth year]  Date: 05/22/2024  PCP: Wendee Lynwood HERO, NP  Referral: Wendee Lynwood HERO, NP  Chief Complaint  Patient presents with   Knee Pain    Right   Shoulder Pain    Left   Subjective:   Geoffrey Orozco is a 63 y.o. very pleasant male patient with Body mass index is 31.6 kg/m. who presents with the following:  Discussed the use of AI scribe software for clinical note transcription with the patient, who gave verbal consent to proceed.  Geoffrey Orozco is a new patient to me today, he presents with some ongoing knee pain and shoulder pain.  History of Present Illness Geoffrey Orozco is a 63 year old male who presents with persistent right knee and left shoulder pain.  He has been experiencing ongoing knee pain since undergoing arthroscopic surgery for a torn meniscus two years ago. The pain has been persistent and worsening, with a notable increase in discomfort during rainy weather. He did not participate in formal physical therapy post-surgery and manages the pain independently. The pain is most pronounced at night, particularly when sleeping on his side, often waking him due to deep pain. He finds some relief using a wrap.  Regarding his shoulder, he describes pain when raising his arm, which has been an issue for several years. 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 He manages the shoulder pain through stretching, but his efforts to maintain a workout routine are often interrupted by illness or physical limitations. No major injury to the shoulder joint is reported.  Socially, he is retired from event organiser and currently babysits his two young grandchildren, aged three years and six months. He has  stopped working due to his physical ailments.    Review of Systems is noted in the HPI, as appropriate  Objective:   BP (!) 146/82   Pulse 70   Temp 98.2 F (36.8 C) (Temporal)   Ht 5' 10 (1.778 m)   Wt 220 lb 4 oz (99.9 kg)   SpO2 98%   BMI 31.60 kg/m   GEN: No acute distress; alert,appropriate. PULM: Breathing comfortably in no respiratory distress PSYCH: Normally interactive.   Laboratory and Imaging Data:  Assessment and Plan:     ICD-10-CM   1. Impingement syndrome of left shoulder  M75.42     2. Chronic left shoulder pain  M25.512 DG Shoulder Left   G89.29 Ambulatory referral to Physical Therapy    3. Chronic pain of right knee  M25.561 DG Knee 4 Views W/Patella Right   G89.29 Ambulatory referral to Physical Therapy    4. Primary osteoarthritis of right knee  M17.11      Total encounter time: 35 minutes. This includes total time spent on the day of encounter.  This includes prep time, independent x-ray review, anatomy review and discussion about plans and treatment options. Assessment & Plan Right knee pain, status post meniscal surgery with osteoarthritis Chronic knee pain post-meniscal surgery with osteoarthritis, worsened symptoms, especially nocturnal pain. - Ordered knee x-ray to assess current arthritis status, which shows mild medial compartmental osteoarthritis - The pain is variable, and it is not really bothering him all that much right now.  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 - Ordered shoulder x-ray to evaluate for arthritis or structural issues.  He does have some mild acromioclavicular arthritis, and he also has some mild narrowing in the inferior aspect of the glenohumeral joint. - I am going to have him start working on some rotator cuff strengthening and scapular stabilization, I think he would have great benefit from formal physical therapy.  Medication  Management during today's office visit: No orders of the defined types were placed in this encounter.  There are no discontinued medications.  Orders placed today for conditions managed today: Orders Placed This Encounter  Procedures   DG Knee 4 Views W/Patella Right   DG Shoulder Left   Ambulatory referral to Physical Therapy    Disposition: No follow-ups on file.  Dr[employer] One speech-to-text software was used for transcription in this dictation.  Possible transcriptional errors can occur using Animal nutritionist.   Signed,  Jacques DASEN. Jonluke Cobbins, MD   Outpatient Encounter Medications as of 05/22/2024  Medication Sig   aspirin EC 81 MG tablet Take by mouth.   azelastine (ASTELIN) 0.1 % nasal spray Place 2 sprays into both nostrils 2 (two) times daily. Use in each nostril as directed   benzonatate  (TESSALON ) 200 MG capsule Take 1 capsule (200 mg total) by mouth 2 (two) times daily as needed for cough.   Bioflavonoid Products (BIOFLEX PO) Take by mouth.   cetirizine  (ZYRTEC ) 10 MG tablet Take 1 tablet (10 mg total) by mouth daily.   doxazosin (CARDURA) 1 MG tablet Take 1 tablet by mouth at bedtime.   EPINEPHrine 0.3 mg/0.3 mL IJ SOAJ injection PLEASE SEE ATTACHED FOR DETAILED DIRECTIONS   famotidine  (PEPCID ) 20 MG tablet TAKE 1 TABLET BY MOUTH TWICE A DAY   fluticasone (FLONASE) 50 MCG/ACT nasal spray Place 2 sprays into both nostrils daily.   ketoconazole (NIZORAL) 2 % cream Apply topically.   lisinopril  (ZESTRIL ) 20 MG tablet TAKE 1 TABLET BY MOUTH EVERY DAY   Olopatadine HCl (PATADAY) 0.7 % SOLN    pantoprazole  (PROTONIX ) 20 MG tablet Take 1 tablet (20 mg total) by mouth daily.   predniSONE  (DELTASONE ) 20 MG tablet Take 2 tablets (40 mg total) by mouth daily with breakfast for 7 days.   REPATHA SURECLICK 140 MG/ML SOAJ Inject 1 mL into the skin every 14 (fourteen) days.   Saw Palmetto, Serenoa repens, 450 MG CAPS Take by mouth.   No facility-administered encounter  medications on file as of 05/22/2024.   "

## 2024-05-22 ENCOUNTER — Ambulatory Visit
Admission: RE | Admit: 2024-05-22 | Discharge: 2024-05-22 | Disposition: A | Source: Ambulatory Visit | Attending: Family Medicine | Admitting: Family Medicine

## 2024-05-22 ENCOUNTER — Encounter: Payer: Self-pay | Admitting: Family Medicine

## 2024-05-22 ENCOUNTER — Ambulatory Visit (INDEPENDENT_AMBULATORY_CARE_PROVIDER_SITE_OTHER): Admitting: Family Medicine

## 2024-05-22 VITALS — BP 146/82 | HR 70 | Temp 98.2°F | Ht 70.0 in | Wt 220.2 lb

## 2024-05-22 DIAGNOSIS — M25561 Pain in right knee: Secondary | ICD-10-CM | POA: Diagnosis not present

## 2024-05-22 DIAGNOSIS — G8929 Other chronic pain: Secondary | ICD-10-CM

## 2024-05-22 DIAGNOSIS — M25512 Pain in left shoulder: Secondary | ICD-10-CM

## 2024-05-22 DIAGNOSIS — M1711 Unilateral primary osteoarthritis, right knee: Secondary | ICD-10-CM

## 2024-05-22 DIAGNOSIS — M7542 Impingement syndrome of left shoulder: Secondary | ICD-10-CM

## 2024-05-24 ENCOUNTER — Other Ambulatory Visit: Payer: Self-pay | Admitting: Nurse Practitioner

## 2024-05-24 DIAGNOSIS — R42 Dizziness and giddiness: Secondary | ICD-10-CM

## 2024-05-31 ENCOUNTER — Ambulatory Visit: Payer: Self-pay | Admitting: Family Medicine

## 2024-07-02 ENCOUNTER — Encounter (HOSPITAL_COMMUNITY): Payer: Self-pay | Admitting: *Deleted

## 2024-07-02 ENCOUNTER — Emergency Department (HOSPITAL_COMMUNITY)

## 2024-07-02 ENCOUNTER — Emergency Department (HOSPITAL_COMMUNITY)
Admission: EM | Admit: 2024-07-02 | Discharge: 2024-07-03 | Disposition: A | Discharge status: Home / Self Care | Attending: Emergency Medicine | Admitting: Emergency Medicine

## 2024-07-02 ENCOUNTER — Other Ambulatory Visit: Payer: Self-pay

## 2024-07-02 DIAGNOSIS — Z7951 Long term (current) use of inhaled steroids: Secondary | ICD-10-CM | POA: Insufficient documentation

## 2024-07-02 DIAGNOSIS — R7401 Elevation of levels of liver transaminase levels: Secondary | ICD-10-CM | POA: Diagnosis not present

## 2024-07-02 DIAGNOSIS — J45909 Unspecified asthma, uncomplicated: Secondary | ICD-10-CM | POA: Diagnosis not present

## 2024-07-02 DIAGNOSIS — H81399 Other peripheral vertigo, unspecified ear: Secondary | ICD-10-CM

## 2024-07-02 DIAGNOSIS — H81391 Other peripheral vertigo, right ear: Secondary | ICD-10-CM | POA: Diagnosis not present

## 2024-07-02 DIAGNOSIS — Z7982 Long term (current) use of aspirin: Secondary | ICD-10-CM | POA: Insufficient documentation

## 2024-07-02 DIAGNOSIS — I10 Essential (primary) hypertension: Secondary | ICD-10-CM | POA: Insufficient documentation

## 2024-07-02 DIAGNOSIS — R739 Hyperglycemia, unspecified: Secondary | ICD-10-CM | POA: Insufficient documentation

## 2024-07-02 DIAGNOSIS — Z79899 Other long term (current) drug therapy: Secondary | ICD-10-CM | POA: Diagnosis not present

## 2024-07-02 DIAGNOSIS — I2510 Atherosclerotic heart disease of native coronary artery without angina pectoris: Secondary | ICD-10-CM | POA: Insufficient documentation

## 2024-07-02 DIAGNOSIS — R42 Dizziness and giddiness: Secondary | ICD-10-CM | POA: Diagnosis present

## 2024-07-02 HISTORY — DX: Atherosclerotic heart disease of native coronary artery without angina pectoris: I25.10

## 2024-07-02 LAB — DIFFERENTIAL
Abs Immature Granulocytes: 0.07 K/uL (ref 0.00–0.07)
Basophils Absolute: 0 K/uL (ref 0.0–0.1)
Basophils Relative: 1 %
Eosinophils Absolute: 0.1 K/uL (ref 0.0–0.5)
Eosinophils Relative: 1 %
Immature Granulocytes: 1 %
Lymphocytes Relative: 18 %
Lymphs Abs: 1.5 K/uL (ref 0.7–4.0)
Monocytes Absolute: 0.7 K/uL (ref 0.1–1.0)
Monocytes Relative: 8 %
Neutro Abs: 6 K/uL (ref 1.7–7.7)
Neutrophils Relative %: 71 %

## 2024-07-02 LAB — I-STAT CHEM 8, ED
BUN: 23 mg/dL (ref 8–23)
Calcium, Ion: 1.18 mmol/L (ref 1.15–1.40)
Chloride: 104 mmol/L (ref 98–111)
Creatinine, Ser: 0.8 mg/dL (ref 0.61–1.24)
Glucose, Bld: 112 mg/dL — ABNORMAL HIGH (ref 70–99)
HCT: 47 % (ref 39.0–52.0)
Hemoglobin: 16 g/dL (ref 13.0–17.0)
Potassium: 3.7 mmol/L (ref 3.5–5.1)
Sodium: 137 mmol/L (ref 135–145)
TCO2: 21 mmol/L — ABNORMAL LOW (ref 22–32)

## 2024-07-02 LAB — APTT: aPTT: 36 s (ref 24–36)

## 2024-07-02 LAB — COMPREHENSIVE METABOLIC PANEL WITH GFR
ALT: 80 U/L — ABNORMAL HIGH (ref 0–44)
AST: 45 U/L — ABNORMAL HIGH (ref 15–41)
Albumin: 4.6 g/dL (ref 3.5–5.0)
Alkaline Phosphatase: 64 U/L (ref 38–126)
Anion gap: 14 (ref 5–15)
BUN: 21 mg/dL (ref 8–23)
CO2: 21 mmol/L — ABNORMAL LOW (ref 22–32)
Calcium: 10.2 mg/dL (ref 8.9–10.3)
Chloride: 101 mmol/L (ref 98–111)
Creatinine, Ser: 0.87 mg/dL (ref 0.61–1.24)
GFR, Estimated: >60 mL/min
Glucose, Bld: 110 mg/dL — ABNORMAL HIGH (ref 70–99)
Potassium: 3.8 mmol/L (ref 3.5–5.1)
Sodium: 136 mmol/L (ref 135–145)
Total Bilirubin: 0.8 mg/dL (ref 0.0–1.2)
Total Protein: 7.6 g/dL (ref 6.5–8.1)

## 2024-07-02 LAB — CBG MONITORING, ED: Glucose-Capillary: 115 mg/dL — ABNORMAL HIGH (ref 70–99)

## 2024-07-02 LAB — CBC
HCT: 46.6 % (ref 39.0–52.0)
Hemoglobin: 16.2 g/dL (ref 13.0–17.0)
MCH: 29.7 pg (ref 26.0–34.0)
MCHC: 34.8 g/dL (ref 30.0–36.0)
MCV: 85.5 fL (ref 80.0–100.0)
Platelets: 249 K/uL (ref 150–400)
RBC: 5.45 MIL/uL (ref 4.22–5.81)
RDW: 13.5 % (ref 11.5–15.5)
WBC: 8.4 K/uL (ref 4.0–10.5)
nRBC: 0 % (ref 0.0–0.2)

## 2024-07-02 LAB — TROPONIN T, HIGH SENSITIVITY
Troponin T High Sensitivity: <15 ng/L (ref 0–19)
Troponin T High Sensitivity: <15 ng/L (ref 0–19)

## 2024-07-02 LAB — PROTIME-INR
INR: 1 (ref 0.8–1.2)
Prothrombin Time: 13.5 s (ref 11.4–15.2)

## 2024-07-02 LAB — ETHANOL: Alcohol, Ethyl (B): <15 mg/dL

## 2024-07-02 MED ORDER — DIAZEPAM 5 MG/ML IJ SOLN
2.5000 mg | Freq: Once | INTRAMUSCULAR | Status: AC
  Administered 2024-07-02: 2.5 mg via INTRAVENOUS
  Filled 2024-07-02: qty 2

## 2024-07-02 MED ORDER — IOHEXOL 350 MG/ML SOLN
75.0000 mL | Freq: Once | INTRAVENOUS | Status: AC | PRN
  Administered 2024-07-02: 75 mL via INTRAVENOUS

## 2024-07-02 MED ORDER — ONDANSETRON 4 MG PO TBDP
4.0000 mg | ORAL_TABLET | Freq: Once | ORAL | Status: DC
  Filled 2024-07-02: qty 1

## 2024-07-02 MED ORDER — SODIUM CHLORIDE 0.9 % IV BOLUS
1000.0000 mL | Freq: Once | INTRAVENOUS | Status: AC
  Administered 2024-07-02: 1000 mL via INTRAVENOUS

## 2024-07-02 MED ORDER — MECLIZINE HCL 25 MG PO TABS
25.0000 mg | ORAL_TABLET | Freq: Once | ORAL | Status: AC
  Administered 2024-07-02: 25 mg via ORAL
  Filled 2024-07-02: qty 1

## 2024-07-02 MED ORDER — SODIUM CHLORIDE 0.9% FLUSH: 3.0000 mL | Freq: Once | INTRAVENOUS | Status: DC

## 2024-07-02 NOTE — ED Provider Triage Note (Signed)
 Emergency Medicine Provider Triage Evaluation Note  Geoffrey Orozco , a 64 y.o. male  was evaluated in triage.  Pt complains of dizziness, lightheadedness, weakness that began around 615 after breakfast this morning.  Notes felt very weak and felt like he could pass out, denies syncopal episode.  States he has had episodes like this previously but do not normally last as long.  Notes continued symptoms at this time.  Symptoms of lightheadedness worse when he tries to move or stand up.  On blood pressure medications but no recent changes.  Denies blood thinners.  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  Denies chest pain or shortness of breath.  Review of Systems  Positive: Dizziness, weakness, lightheadedness Negative: Syncope, headache, chest pain, shortness of breath  Physical Exam  BP (!) 175/100   Pulse 83   Temp 98.3 F (36.8 C)   Resp 19   SpO2 100%  Gen:   Awake, no distress   Resp:  Normal effort  MSK:   Moves extremities without difficulty  Other:  Mildly pale but no acute distress  Medical Decision Making  Medically screening exam initiated at 1:04 PM.  Appropriate orders placed.  Geoffrey Orozco was informed that the remainder of the evaluation will be completed by another provider, this initial triage assessment does not replace that evaluation, and the importance of remaining in the ED until their evaluation is complete.     Neysa Thersia RAMAN, [place] 07/02/24 1304

## 2024-07-02 NOTE — ED Provider Notes (Signed)
 " [place] [facility] DEPARTMENT AT [facility] Provider Note   CSN: [id number] Arrival date & time: 07/02/24  1103     Patient presents with: Dizziness and Nausea   Geoffrey Orozco is a 64 y.o. male who presents emergency department with chief complaint of vertigo.  Patient reports that he had onset of room spinning dizziness worse with movement or head position change starting at around 6:20 AM.  He is also complaining of right sided neck pain which seem to begin around the same time.  No recent trauma.  He states that he has had multiple episodes of vomiting.  He feels better as long as he is still and the spinning sensation is controlled however as soon as he moves his head or moves his body he is very nauseated and the room seems to spin.  He has never had anything like this before.  It seems to be worse toward the left.  He has had several episodes of vomiting.  He was given Zofran  earlier and continued to have nausea and vomiting.  He has difficulty ambulating without assistance at this time.  He denies ringing in his ear.    Dizziness      Prior to Admission medications  Medication Sig Start Date End Date Taking? Authorizing Provider  aspirin EC 81 MG tablet Take by mouth.    [provider]  azelastine (ASTELIN) 0.1 % nasal spray Place 2 sprays into both nostrils 2 (two) times daily. Use in each nostril as directed    [provider]  benzonatate  (TESSALON ) 200 MG capsule Take 1 capsule (200 mg total) by mouth 2 (two) times daily as needed for cough. 05/16/24   Bacigalupo, Angela M, MD  Bioflavonoid Products (BIOFLEX PO) Take by mouth.    [provider]  cetirizine  (ZYRTEC ) 10 MG tablet Take 1 tablet (10 mg total) by mouth daily. 02/08/24   Wendee Lynwood HERO, NP  doxazosin (CARDURA) 1 MG tablet Take 1 tablet by mouth at bedtime. 09/08/20   [provider]  EPINEPHrine 0.3 mg/0.3 mL IJ SOAJ injection PLEASE SEE ATTACHED FOR DETAILED  DIRECTIONS 12/28/22   [provider]  famotidine  (PEPCID ) 20 MG tablet TAKE 1 TABLET BY MOUTH TWICE A DAY 12/11/23   [place], PA-C  fluticasone [facility]) 50 MCG/ACT nasal spray Place 2 sprays into both nostrils daily. 10/26/21   [provider]  ketoconazole (NIZORAL) 2 % cream Apply topically. 08/10/23   [provider]  lisinopril  (ZESTRIL ) 20 MG tablet TAKE 1 TABLET BY MOUTH EVERY DAY 05/27/24   Wendee Lynwood HERO, NP  Olopatadine HCl (PATADAY) 0.7 % SOLN  04/18/23   [provider]  pantoprazole  (PROTONIX ) 20 MG tablet Take 1 tablet (20 mg total) by mouth daily. 02/08/24   Wendee Lynwood HERO, NP  REPATHA SURECLICK 140 MG/ML SOAJ Inject 1 mL into the skin every 14 (fourteen) days. 03/04/22   [provider]  Saw Palmetto, Serenoa repens, 450 MG CAPS Take by mouth.    [provider]    Allergies: Nitroglycerin and Erythromycin    Review of Systems  Neurological:  Positive for dizziness.    Updated Vital Signs BP (!) 147/91 (BP Location: Left Arm)   Pulse 80   Temp 97.8 F (36.6 C) (Oral)   Resp 18   SpO2 100%   Physical Exam Vitals and nursing note reviewed.  Constitutional:      General: He is not in acute distress.    Appearance:  He is well-developed. He is not diaphoretic.  HENT:     Head: Normocephalic and atraumatic.  Eyes:     General: No scleral icterus.    Conjunctiva/sclera: Conjunctivae normal.  Cardiovascular:     Rate and Rhythm: Normal rate and regular rhythm.     Heart sounds: Normal heart sounds.  Pulmonary:     Effort: Pulmonary effort is normal. No respiratory distress.     Breath sounds: Normal breath sounds.  Abdominal:     Palpations: Abdomen is soft.     Tenderness: There is no abdominal tenderness.  Musculoskeletal:     Cervical back: Normal range of motion and neck supple.  Skin:    General: Skin is warm and dry.  Neurological:     Mental Status: He is alert.     Comments: Head Impulse + with R  sided catch up saccade Negative Test of Skew Positive Unidirectional nystagmus with persistent R sided beats that do not fatigue Normal finger to nose and heal to shin Gait deffered   Psychiatric:        Behavior: Behavior normal.     (all labs ordered are listed, but only abnormal results are displayed) Labs Reviewed  COMPREHENSIVE METABOLIC PANEL WITH GFR - Abnormal; Notable for the following components:      Result Value   CO2 21 (*)    Glucose, Bld 110 (*)    AST 45 (*)    ALT 80 (*)    All other components within normal limits  I-STAT CHEM 8, ED - Abnormal; Notable for the following components:   Glucose, Bld 112 (*)    TCO2 21 (*)    All other components within normal limits  CBG MONITORING, ED - Abnormal; Notable for the following components:   Glucose-Capillary 115 (*)    All other components within normal limits  PROTIME-INR  APTT  CBC  DIFFERENTIAL  ETHANOL  TROPONIN T, HIGH SENSITIVITY  TROPONIN T, HIGH SENSITIVITY    EKG: None  Radiology: DG Chest 1 View Result Date: 07/02/2024 CLINICAL DATA:  Weakness. EXAM: CHEST  1 VIEW COMPARISON:  08/10/2022 FINDINGS: Normal heart size. Status post prior CABG. Stable tortuosity of the thoracic aorta. There is no evidence of pulmonary edema, consolidation, pneumothorax or pleural fluid. The visualized skeletal structures are unremarkable. IMPRESSION: No acute findings. Electronically Signed   By: Marcey Moan M.D.   On: 07/02/2024 14:42   CT HEAD WO CONTRAST Result Date: 07/02/2024 EXAM: CT HEAD WITHOUT 07/02/2024 12:45:58 PM TECHNIQUE: CT of the head was performed without the administration of intravenous contrast. Automated exposure control, iterative reconstruction, and/or weight based adjustment of the mA/kV was utilized to reduce the radiation dose to as low as reasonably achievable. COMPARISON: CT head 03/20/2023. CLINICAL HISTORY: 64 year old male. Syncope/presyncope, cerebrovascular cause suspected. Dizziness and  nausea which began after breakfast this morning. FINDINGS: BRAIN AND VENTRICLES: No acute intracranial hemorrhage. No mass effect or midline shift. No extra-axial fluid collection. No evidence of acute infarct. No hydrocephalus. Brain volume remains normal for age. Gray white differentiation stable and normal for age. No suspicious intracranial vascular hyperdensity. Mild for age calcified atherosclerosis at the skull base. ORBITS: Negative orbits. SINUSES AND MASTOIDS: Mild to moderate increased bilateral paranasal sinus mucosal thickening. No sinus fluid levels. Tympanic cavities and mastoids remain clear. SOFT TISSUES AND SKULL: No acute skull fracture. Chronic left superior scalp soft tissue scarring is stable. Small benign anterior left frontal bone exostosis is stable on series 4 image  46. IMPRESSION: 1. Normal for age non-contrast CT appearance of the brain. 2. Mild to moderate paranasal sinus inflammation. Electronically signed by: Helayne Hurst MD 07/02/2024 01:24 PM EST RP Workstation: [id number]     Procedures   Medications Ordered in the ED  sodium chloride  flush (NS) 0.9 % injection 3 mL (has no administration in time range)  ondansetron  (ZOFRAN -ODT) disintegrating tablet 4 mg (has no administration in time range)  sodium chloride  0.9 % bolus 1,000 mL (has no administration in time range)  diazepam  (VALIUM ) injection 2.5 mg (has no administration in time range)  meclizine  (ANTIVERT ) tablet 25 mg (has no administration in time range)                                    Medical Decision Making Amount and/or Complexity of Data Reviewed Labs: ordered. Radiology: ordered.  Risk Prescription drug management.   This patient presents to the ED for concern of vertigo, this involves an extensive number of treatment options, and is a complaint that carries with it a high risk of complications and morbidity.  The emergent differential diagnosis for acute vertigo low includes peripheral causes  such as BPPV, barotrauma, ear foreign body, Mnire's disease, infectious causes such as lip bronchitis, vestibular neuritis or Ramsay Hunt syndrome.  Other emergent causes are central such as cerebellar stroke, vertebrobasilar insufficiency, neoplastic causes, vertebral artery dissection, MS, neurosyphilis or tuberculosis, epilepsy or migraine.  Other causes include anemia, hyperviscosity syndrome, alcohol or aminoglycoside use, renal failure, hypoglycemia and thyroid  disease.   Co morbidities:   has a past medical history of Allergy  (1971), Arthritis (2018), Asthma, Coronary artery disease, GERD (gastroesophageal reflux disease) (2024), Hypertension, Pre-diabetes, and Sleep apnea (1998).   Social Determinants of Health:   SDOH Screenings   Food Insecurity: No Food Insecurity (05/16/2024)  Housing: Low Risk (05/16/2024)  Transportation Needs: No Transportation Needs (05/16/2024)  Utilities: Not At Risk (07/10/2023)   Received from [facility]  Alcohol Screen: Low Risk (05/16/2024)  Depression (PHQ2-9): Low Risk (02/08/2024)  Financial Resource Strain: Low Risk (05/16/2024)  Physical Activity: Insufficiently Active (05/16/2024)  Social Connections: Socially Integrated (05/16/2024)  Stress: No Stress Concern Present (05/16/2024)  Tobacco Use: Low Risk (07/02/2024)  \  Additional history: Additional history provided by patient's wife at bedside    Lab Tests:  I  personally interpreted labs.  The pertinent results include:   Labs reviewed.  Mildly elevated blood glucose of insignificant value mild elevated AST ALT also not pertinent to current workup.   Imaging Studies:  I ordered imaging studies including CT head, CT angiogram head and neck I independently visualized and interpreted imaging which showed no acute findings I agree with the radiologist interpretation  Cardiac Monitoring/ECG:  The patient was maintained on a cardiac monitor.  I personally viewed and interpreted  the cardiac monitored which showed an underlying rhythm of:   Heart rate of 73 normal sinus rhythm     Medicines ordered and prescription drug management:  I ordered medication including  Medications  sodium chloride  0.9 % bolus 1,000 mL (0 mLs Intravenous Stopped 07/03/24 0041)  diazepam  (VALIUM ) injection 2.5 mg (2.5 mg Intravenous Given 07/02/24 2201)  meclizine  (ANTIVERT ) tablet 25 mg (25 mg Oral Given 07/02/24 2200)  iohexol  (OMNIPAQUE ) 350 MG/ML injection 75 mL (75 mLs Intravenous Contrast Given 07/02/24 2237)   for vertigo Reevaluation of the patient after these medicines showed that the patient resolved, now  able to ambulate without assisstanec I have reviewed the patients home medicines and have made adjustments as needed  Test Considered:  MRI brain however findings consistent with peripheral vertigo  Critical Interventions:    Consultations Obtained:   Problem List / ED Course:     ICD-10-CM   1. Peripheral vertigo, unspecified laterality  H81.399       MDM:   The patient presented to the emergency department with new onset vertigo. This presentation required evaluation for potentially life-threatening central causes, particularly posterior circulation stroke, versus benign peripheral vestibular etiologies.  Diagnostic Evaluation: A comprehensive HINTS (Head Impulse, Nystagmus, Test of Skew) examination was performed to differentiate central from peripheral causes of acute vestibular syndrome. The examination revealed findings consistent with a peripheral etiology: unidirectional right-sided nystagmus, negative test of skew, and right-sided catch-up saccade on head impulse testing. This examination pattern is reassuring against a central cause such as posterior circulation stroke and supports a diagnosis of acute peripheral vestibulopathy, most likely vestibular neuritis.   Additional Testing: Given the patient's complaint of right-sided neck pain in conjunction with  vertigo, CT angiography of the head and neck was obtained to evaluate for vertebral artery dissection, a potentially serious vascular cause that can present with this symptom constellation. The CTA returned negative for dissection or other vascular abnormality.   Neurological Assessment: The patient demonstrated normal coordination on comprehensive neurological testing, with no evidence of cerebellar dysfunction, focal weakness, or sensory deficits. The patient was able to ambulate independently without assistance, further supporting a peripheral rather than central etiology.  Therapeutic Intervention and Response: The patient received symptomatic treatment including intravenous fluids for hydration, diazepam  for vestibular suppression, and meclizine  for vertigo control. The patient demonstrated significant symptomatic improvement with this conservative management approach.  Risk Stratification: The overall clinical picture, including the reassuring HINTS examination findings, negative vascular imaging, symptomatic improvement with conservative management, preserved ambulatory function, and normal coordination testing, indicates a low risk for central causes of vertigo. The presentation is most consistent with acute peripheral vestibulopathy (vestibular neuritis).  Disposition: The patient is appropriate for discharge home with outpatient follow-up and vestibular rehabilitation referral.     .      Final diagnoses:  Peripheral vertigo, unspecified laterality    ED Discharge Orders     None          Arloa Chroman, PA-C 07/03/24 1557    Franklyn Sid SAILOR, MD 07/04/24 1003  "

## 2024-07-02 NOTE — ED Triage Notes (Signed)
 Pt states started having dizziness and nausea that started after breakfast at 0620. Pt states that he has had this dizziness before and last time he had it he passed out. Reports it was related to dehydration. Pt had CABG 17 years ago. No history of diabetes.  Pt states headache to the back of his neck and feels tightness. No chest tightness or pain. Pt states feel weakness all over and pt feels clammy. Pt reports trembling to bilateral UE. Reports twitching to both legs

## 2024-07-03 MED ORDER — DIAZEPAM 2 MG PO TABS: 2.0000 mg | ORAL_TABLET | Freq: Four times a day (QID) | ORAL | 0 refills | Status: AC | PRN

## 2024-07-03 MED ORDER — MECLIZINE HCL 50 MG PO TABS: 25.0000 mg | ORAL_TABLET | Freq: Three times a day (TID) | ORAL | 0 refills | Status: AC | PRN

## 2024-07-03 NOTE — Discharge Instructions (Addendum)
 ### Understanding and Managing Your Peripheral Vertigo     ## What is Peripheral Vertigo?        Peripheral vertigo is a spinning or dizzy sensation caused by a problem in your inner ear, which helps control your balance. Common causes include inner ear infections, inflammation, or changes in the position of tiny crystals in your ear. While the symptoms can be frightening, peripheral vertigo is treatable and most people improve with proper care.      ## Your Symptoms        You may experience:      - A spinning sensation (feeling like you or the room is moving)      - Dizziness or lightheadedness      - Nausea or vomiting      - Balance problems      - Difficulty focusing your vision during head movements      ## Your Treatment Plan        **Medications for Symptom Relief**      Your doctor has prescribed medication to help control your symptoms:      - **Meclizine  25-50 mg**: Take as directed by your doctor. This medication helps reduce the spinning sensation and nausea. It works best when taken at the first sign of symptoms. Common side effects include drowsiness and dry mouth. Do not drive or operate machinery if you feel drowsy.      - **Diazepam  (Valium )**: This medication may be prescribed if meclizine  does not adequately control your severe symptoms. Take exactly as prescribed. This medication can cause drowsiness and has a risk of dependence, so it should only be used for a short period (a few days).      **Important**: These medications are meant for short-term use only (typically several days). Using them for longer periods can actually slow down your recovery by interfering with your body's natural ability to compensate for the inner ear problem.      ## Vestibular Rehabilitation Therapy        **Physical therapy is an essential part of your recovery.** You will be referred to a physical therapist who specializes in vestibular rehabilitation. This therapy  involves specific exercises designed to:      - Help your brain adapt to the changes in your inner ear      - Improve your balance and coordination      - Reduce dizziness with movement      - Restore your confidence in daily activities      Vestibular rehabilitation has been proven highly effective for treating peripheral vertigo and can significantly improve your symptoms and quality of life. The exercises are customized to your specific needs and gradually progress as you improve.      ## What to Expect        - **Acute phase (first few days)**: Symptoms may be severe. Medications can help during this time.      - **Recovery phase (days to weeks)**: As your brain begins to compensate, symptoms will gradually improve. This is when vestibular rehabilitation becomes most important.      - **Long-term**: Most people experience significant improvement or complete resolution of symptoms with proper treatment.      ## When to Seek Immediate Medical Attention        Contact your doctor or go to the emergency room if you experience:      - Severe headache or neck stiffness      - Double vision  or vision loss      - Difficulty speaking or slurred speech      - Weakness or numbness in your face, arms, or legs      - Difficulty walking or severe imbalance      - High fever      - Hearing loss that is new or worsening      These symptoms could indicate a more serious condition requiring immediate evaluation.      ## Tips for Managing Your Symptoms        - Move slowly when changing positions (sitting to standing, getting out of bed)      - Keep your home well-lit and remove tripping hazards      - Use handrails when available      - Avoid driving until your symptoms are well-controlled      - Stay hydrated      - Avoid alcohol, which can worsen dizziness      - Continue your vestibular exercises as prescribed, even if they initially make you slightly dizzy      ##  Follow-Up Care        - Take your medications as prescribed      - Attend all scheduled physical therapy appointments      - Follow up with your doctor as recommended      - Contact your doctor if your symptoms worsen or do not improve within a few weeks      Remember, recovery takes time, but with proper treatment including medication for acute symptoms and vestibular rehabilitation therapy, most people with peripheral vertigo experience significant improvement.

## 2024-07-08 ENCOUNTER — Ambulatory Visit: Payer: Self-pay

## 2024-07-08 NOTE — Telephone Encounter (Signed)
" °  FYI Only or Action Required?: FYI only for provider: appointment scheduled on 01.13.26.  Patient was last seen in primary care on 05/22/2024 by Watt Mirza, MD.  Called Nurse Triage reporting Dizziness.  Symptoms began several days ago.  Interventions attempted: Prescription medications: meclizine  and diazepam .  Symptoms are: unchanged.  Triage Disposition: See Physician Within 24 Hours  Patient/caregiver understands and will follow disposition?: Yes  Copied from CRM #[id number]. Topic: Clinical - Red Word Triage >> Jul 08, 2024  8:49 AM Eva FALCON wrote: Red Word that prompted transfer to Nurse Triage: dizziness that has not stop since last Tuesday, nausea, difficulty when trying to stand up, laying down he feels okay.  He went to ER last Tuesday and was diagnosed with Vertigo, but is not getting any better even with medication prescribed. Reason for Disposition  [1] MODERATE dizziness (e.g., vertigo; feels very unsteady, interferes with normal activities) AND [2] has NOT been evaluated by doctor (or NP/PA) for this  Answer Assessment - Initial Assessment Questions 1. DESCRIPTION: Describe your dizziness.      Dizziness, tilting/spinning room  2. VERTIGO: Do you feel like either you or the room is spinning or tilting?       Yes, vertigo  3. LIGHTHEADED: Do you feel lightheaded? (e.g., somewhat faint, woozy, weak upon standing)     Denies  4. SEVERITY: How bad is it?  Can you walk?      Unable to perform ADLs  5. ONSET:  When did the dizziness begin?      X 6 days  6. AGGRAVATING FACTORS: Does anything make it worse? (e.g., standing, change in head position)     Changing positions worsen symptoms  7. CAUSE: What do you think is causing the dizziness?     Vertigo dx at ER a few days ago  8. RECURRENT SYMPTOM: Have you had dizziness before? If Yes, ask: When was the last time? What happened that time?      Yes, recurrent  9. OTHER SYMPTOMS: Pt  denies earache, headache, numbness, tinnitus, vomiting, weakness Pt reports nausea comes and goes  Pt states he has only taken one dose of the prescribed medication  Pt reports dizziness Pt is taking meclizine  and diazepam  Pt scheduled for a visit on  01.13.26 for further evaluation due to inability to schedule a post discharge visit within recommended time frame Pt agrees with plan of care, will call back for any worsening symptoms  Protocols used: Dizziness - Vertigo-A-AH  "

## 2024-07-08 NOTE — Telephone Encounter (Signed)
 Noted

## 2024-07-08 NOTE — Telephone Encounter (Signed)
 Will see patient then Agree with ER and UC precautions

## 2024-07-09 ENCOUNTER — Telehealth: Payer: Self-pay

## 2024-07-09 ENCOUNTER — Encounter: Payer: Self-pay | Admitting: Family Medicine

## 2024-07-09 ENCOUNTER — Ambulatory Visit (INDEPENDENT_AMBULATORY_CARE_PROVIDER_SITE_OTHER): Admitting: Family Medicine

## 2024-07-09 VITALS — BP 138/70 | HR 65 | Temp 97.6°F | Ht 70.0 in | Wt 218.2 lb

## 2024-07-09 DIAGNOSIS — I10 Essential (primary) hypertension: Secondary | ICD-10-CM

## 2024-07-09 DIAGNOSIS — H81399 Other peripheral vertigo, unspecified ear: Secondary | ICD-10-CM | POA: Insufficient documentation

## 2024-07-09 DIAGNOSIS — H81391 Other peripheral vertigo, right ear: Secondary | ICD-10-CM | POA: Diagnosis not present

## 2024-07-09 NOTE — Assessment & Plan Note (Addendum)
 Right sided  Seen in ER on 1/6 with reassuring eval/ found to be peripheral Reviewed hospital records, lab results and studies in detail   Since then-slight improvement but not resolved Using diazepam  and meclizine  Doing epley maneuver   Worse with change in position  Reassuring stable exam today No neuro changes  Right sided nystagmus persists   Will do urgent ENT referral for eval/treatment Will likely need vestibular rehab   Call back and Er precautions noted in detail today    I personally spent a total of 29 minutes in the care of the patient today including preparing to see the patient, getting/reviewing separately obtained history, performing a medically appropriate exam/evaluation, counseling and educating, placing orders, referring and communicating with other health care professionals, and documenting clinical information in the EHR.

## 2024-07-09 NOTE — Patient Instructions (Signed)
 I put the referral in for ENT office  Please let us  know if you don't hear later today to set that up (mychart message or call or letter)   Stay hydrated Change position slowly   If sudden worsening -call 911

## 2024-07-09 NOTE — Progress Notes (Signed)
 "  Subjective:    Patient ID: Geoffrey Orozco, male    DOB: 01/11/1961, 64 y.o.   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  Was referred for vestibular rehab   Overall not much progress  Slightly better  Taking meclizine -does not take the edge off  Also diazepam    Trying to do the epley maneuver   No new symptoms  No hearing change  Little to no hearing change  No change in allergies-does use flonase  Is holding zyrtec    Has to try and keep head still  Change in position -worse    Watching blood pressure  Was up this am jodelle ok    Lab Results  Component Value Date   NA 137 07/02/2024   K 3.7 07/02/2024   CO2 21 (L) 07/02/2024   GLUCOSE 112 (H) 07/02/2024   BUN 23 07/02/2024   CREATININE 0.80 07/02/2024   CALCIUM 10.2 07/02/2024   GFR 65.76 03/23/2023   GFRNONAA >60 07/02/2024   Lab Results  Component Value Date   ALT 80 (H) 07/02/2024   AST 45 (H) 07/02/2024   ALKPHOS 64 07/02/2024   BILITOT 0.8 07/02/2024   History of mildly elevated lft in past  No etoh  No acetaminophen  Lab Results  Component Value Date   WBC 8.4 07/02/2024   HGB 16.0 07/02/2024   HCT 47.0 07/02/2024   MCV 85.5 07/02/2024   PLT 249 07/02/2024   Lab Results  Component Value Date   INR 1.0 07/02/2024   Normal troponin  Treated with  IVF Meclizine  Valium     PMH notable for  HTN  (orthostatic  dizziness with hither lisinopril  dose in past)  OSA GAD Prediabetes Env allergies  CAD with CABG    Had CT angio head and neck on 1/6-reassuring  CT head -reassuring  (some perinasal sinus inflammation)  CT ANGIO HEAD NECK W WO CM Result Date: 07/02/2024 EXAM: CTA HEAD AND NECK WITH AND WITHOUT 07/02/2024 10:37:23 PM TECHNIQUE: CTA of the head and neck was performed with and without the administration of intravenous contrast. Multiplanar 2D and/or 3D reformatted images are provided for review. Automated exposure control, iterative reconstruction, and/or weight based adjustment of the mA/kV was utilized to reduce the radiation dose to as low as reasonably achievable. Stenosis of the internal carotid arteries measured using NASCET criteria. COMPARISON: None available CLINICAL HISTORY: Vertigo, central Vertigo, central FINDINGS: AORTIC ARCH AND ARCH VESSELS: No dissection or arterial injury. No significant stenosis of the brachiocephalic or subclavian arteries. CERVICAL CAROTID ARTERIES: No dissection, arterial injury, or hemodynamically significant stenosis by NASCET criteria. CERVICAL VERTEBRAL ARTERIES: No dissection, arterial injury, or significant stenosis. LUNGS AND MEDIASTINUM: Unremarkable. SOFT TISSUES: No acute abnormality. Thyroid  nodule better characterized on prior ultrasound.  BONES: No acute abnormality. ANTERIOR CIRCULATION: No significant stenosis of the internal carotid arteries. No significant stenosis of the anterior cerebral arteries. No significant stenosis of the middle cerebral arteries. No aneurysm. POSTERIOR CIRCULATION: No significant stenosis of the posterior cerebral arteries. No significant stenosis of the basilar artery. No significant stenosis of the vertebral arteries. No aneurysm. OTHER: No dural venous sinus thrombosis on this non-dedicated study. IMPRESSION: 1. No large vessel occlusion or hemodynamically significant stenosis. Electronically signed by: Gilmore Molt  07/02/2024 10:50 PM EST RP Workstation: [id number]   DG Chest 1 View Result Date: 07/02/2024 CLINICAL DATA:  Weakness. EXAM: CHEST  1 VIEW COMPARISON:  08/10/2022 FINDINGS: Normal heart size. Status post prior CABG. Stable tortuosity of the thoracic aorta. There is no evidence of pulmonary edema, consolidation, pneumothorax or pleural fluid. The visualized skeletal structures are unremarkable. IMPRESSION: No acute findings. Electronically Signed   By: Marcey Moan M.D.   On: 07/02/2024 14:42   CT HEAD WO CONTRAST Result Date: 07/02/2024 EXAM: CT HEAD WITHOUT 07/02/2024 12:45:58 PM TECHNIQUE: CT of the head was performed without the administration of intravenous contrast. Automated exposure control, iterative reconstruction, and/or weight based adjustment of the mA/kV was utilized to reduce the radiation dose to as low as reasonably achievable. COMPARISON: CT head 03/20/2023. CLINICAL HISTORY: 64 year old male. Syncope/presyncope, cerebrovascular cause suspected. Dizziness and nausea which began after breakfast this morning. FINDINGS: BRAIN AND VENTRICLES: No acute intracranial hemorrhage. No mass effect or midline shift. No extra-axial fluid collection. No evidence of acute infarct. No hydrocephalus. Brain volume remains normal for age. Gray white differentiation stable and normal for age. No suspicious intracranial vascular hyperdensity. Mild for age calcified atherosclerosis at the skull base. ORBITS: Negative orbits. SINUSES AND MASTOIDS: Mild to moderate increased bilateral paranasal sinus mucosal thickening. No sinus fluid levels. Tympanic cavities and mastoids remain clear. SOFT TISSUES AND SKULL: No acute skull fracture. Chronic left superior scalp soft tissue scarring is stable. Small benign anterior left frontal bone exostosis is stable on series 4 image 46. IMPRESSION: 1. Normal for age non-contrast CT appearance of the brain. 2. Mild to moderate paranasal sinus inflammation. Electronically signed  by: Helayne Hurst MD 07/02/2024 01:24 PM EST RP Workstation: [id number]     Patient Active Problem List   Diagnosis Date Noted   Peripheral vertigo 07/09/2024   Acute bilateral low back pain without sciatica 02/08/2024   Environmental allergies 02/08/2024   PND (post-nasal drip) 11/08/2023   Thyroid  nodule 10/24/2023   Gastroesophageal reflux disease 10/24/2023   Blurred vision 05/04/2023   Hospital discharge follow-up 03/23/2023   Orthostatic dizziness 03/23/2023   Cecal polyp 10/12/2022   Polyp of descending colon 10/12/2022   Enlarged testicle 09/22/2022   Preventative health care 09/22/2022   Prediabetes 09/22/2022   GAD (generalized anxiety disorder) 08/10/2022   Abdominal bloating 03/24/2022   Class 1 obesity due to excess calories with serious comorbidity and body mass index (BMI) of 33.0 to 33.9 in adult 03/24/2022   Acute medial meniscus tear of right knee 12/15/2021   Coronary artery disease involving native coronary artery of native heart without angina pectoris 09/08/2020   HTN (hypertension) 09/08/2020   Mild aortic regurgitation 09/08/2020   Mixed hyperlipidemia 09/08/2020   OSA (obstructive sleep apnea) 09/08/2020   S/P CABG x 4 09/08/2020   Past Medical History:  Diagnosis Date   Allergy  1971   Seasonal allergies. 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  Needs to be last case with Dr Unk in case he is still c-diff. ([facility])   CORONARY ARTERY BYPASS GRAFT  2009   Quadruple bypass    ESOPHAGOGASTRODUODENOSCOPY (EGD) WITH PROPOFOL  N/A 10/12/2022   Procedure: ESOPHAGOGASTRODUODENOSCOPY (EGD) WITH PROPOFOL ;  Surgeon: Unk Corinn Skiff, MD;  Location: [facility] ENDOSCOPY;  Service: Gastroenterology;  Laterality: N/A;   MENISCUS REPAIR Right 2023   got hurt on the job   PALATE / UVULA BIOPSY / EXCISION     Social History[1] Family History  Problem Relation Age of Onset   Arthritis Mother    Glaucoma Mother    Heart disease Father        pacemaker   Hearing loss Father    Glaucoma Maternal Grandmother    Heart attack Maternal Grandfather 42   Allergies[2] Medications Ordered Prior to Encounter[3]  Review of Systems  Constitutional:  Negative for activity change, appetite change, fatigue, fever and unexpected weight change.  HENT:  Positive for postnasal drip and rhinorrhea. Negative for congestion, sore throat and trouble swallowing.   Eyes:  Negative for pain, redness, itching and visual disturbance.  Respiratory:  Negative for cough, chest tightness, shortness of breath and wheezing.   Cardiovascular:  Negative for chest pain and palpitations.  Gastrointestinal:  Negative for abdominal pain, blood in stool, constipation, diarrhea and nausea.  Endocrine: Negative for cold intolerance, heat intolerance, polydipsia and polyuria.  Genitourinary:  Negative for difficulty urinating, dysuria, frequency and urgency.  Musculoskeletal:  Negative for arthralgias, joint swelling and myalgias.  Skin:  Negative for pallor and rash.  Neurological:  Positive for dizziness. Negative for tremors, seizures, syncope, facial asymmetry, speech difficulty, weakness, light-headedness, numbness and headaches.  Hematological:  Negative for adenopathy. Does not bruise/bleed easily.  Psychiatric/Behavioral:  Negative for decreased concentration and dysphoric mood. The patient is not nervous/anxious.        Objective:   Physical Exam Constitutional:      General: He is not in acute  distress.    Appearance: Normal appearance. He is well-developed. He is obese. He is not ill-appearing or diaphoretic.  HENT:     Head: Normocephalic and atraumatic.     Comments: No facial swelling      Right Ear: Tympanic membrane, ear canal and external ear normal.     Left Ear: Tympanic membrane, ear canal and external ear normal.     Ears:     Comments: Partial cerumen bilat     Nose: Nose normal.     Comments: Boggy nares    Mouth/Throat:     Mouth: Mucous membranes are moist.     Pharynx: No oropharyngeal exudate.  Eyes:     General: No scleral icterus.       Right eye: No discharge.        Left eye: No discharge.     Conjunctiva/sclera: Conjunctivae normal.     Pupils: Pupils are equal, round, and reactive to light.     Comments: Right sided horizontal nystagmus    Neck:     Thyroid : No thyromegaly.     Vascular:  No carotid bruit or JVD.     Trachea: No tracheal deviation.  Cardiovascular:     Rate and Rhythm: Normal rate and regular rhythm.     Heart sounds: Normal heart sounds. No murmur heard. Pulmonary:     Effort: Pulmonary effort is normal. No respiratory distress.     Breath sounds: Normal breath sounds. No stridor. No wheezing, rhonchi or rales.  Abdominal:     General: Bowel sounds are normal. There is no distension.     Palpations: Abdomen is soft. There is no mass.     Tenderness: There is no abdominal tenderness.  Musculoskeletal:        General: No tenderness.     Cervical back: Full passive range of motion without pain, normal range of motion and neck supple.  Lymphadenopathy:     Cervical: No cervical adenopathy.  Skin:    General: Skin is warm and dry.     Coloration: Skin is not pale.     Findings: No rash.  Neurological:     Mental Status: He is alert and oriented to person, place, and time.     Cranial Nerves: No cranial nerve deficit, dysarthria or facial asymmetry.     Sensory: Sensation is intact. No sensory deficit.     Motor: No  weakness, tremor, atrophy, abnormal muscle tone, seizure activity or pronator drift.     Coordination: Coordination normal. Finger-Nose-Finger Test normal.     Gait: Gait normal.     Deep Tendon Reflexes: Reflexes are normal and symmetric.     Comments: No focal cerebellar signs   Psychiatric:        Behavior: Behavior normal.        Thought Content: Thought content normal.           Assessment & Plan:   Problem List Items Addressed This Visit       Cardiovascular and Mediastinum   HTN (hypertension)   Blood pressure up slightly in setting of acute vertigo BP: 138/70  Continues to monitor at home   Lisinopril  20 mg  No further hypotension        Nervous and Auditory   Peripheral vertigo - Primary   Right sided  Seen in ER on 1/6 with reassuring eval/ found to be peripheral Reviewed hospital records, lab results and studies in detail   Since then-slight improvement but not resolved Using diazepam  and meclizine  Doing epley maneuver   Worse with change in position  Reassuring stable exam today No neuro changes  Right sided nystagmus persists   Will do urgent ENT referral for eval/treatment Will likely need vestibular rehab   Call back and Er precautions noted in detail today        Relevant Orders   Ambulatory referral to ENT      [1]  Social History Tobacco Use   Smoking status: Never   Smokeless tobacco: Never  Vaping Use   Vaping status: Never Used  Substance Use Topics   Alcohol use: Yes    Comment: Social   Drug use: Never  [2]  Allergies Allergen Reactions   Nitroglycerin     Other reaction(s): Other CAUSED PASSING OUT  CAUSED PASSING OUT     Erythromycin Rash    Ointment following conjunctivitis  [3]  Current Outpatient Medications on File Prior to Visit  Medication Sig Dispense Refill   aspirin EC 81 MG tablet Take by mouth.     azelastine (ASTELIN) 0.1 % nasal spray Place 2 sprays  into both nostrils 2 (two) times daily. Use in  each nostril as directed     Bioflavonoid Products (BIOFLEX PO) Take by mouth.     cetirizine  (ZYRTEC ) 10 MG tablet Take 1 tablet (10 mg total) by mouth daily. 90 tablet 1   diazepam  (VALIUM ) 2 MG tablet Take 1 tablet (2 mg total) by mouth every 6 (six) hours as needed (vertigo). 12 tablet 0   doxazosin (CARDURA) 1 MG tablet Take 1 tablet by mouth at bedtime.     famotidine  (PEPCID ) 20 MG tablet TAKE 1 TABLET BY MOUTH TWICE A DAY 180 tablet 3   fluticasone (FLONASE) 50 MCG/ACT nasal spray Place 2 sprays into both nostrils daily.     ketoconazole (NIZORAL) 2 % cream Apply topically.     lisinopril  (ZESTRIL ) 20 MG tablet TAKE 1 TABLET BY MOUTH EVERY DAY 90 tablet 1   MAGNESIUM GLYCINATE PO Take 1 capsule by mouth daily.     meclizine  (ANTIVERT ) 50 MG tablet Take 0.5-1 tablets (25-50 mg total) by mouth 3 (three) times daily as needed for dizziness or nausea. 30 tablet 0   Olopatadine HCl (PATADAY) 0.7 % SOLN      REPATHA SURECLICK 140 MG/ML SOAJ Inject 1 mL into the skin every 14 (fourteen) days.     riboflavin (VITAMIN B-2) 100 MG TABS tablet Take 100 mg by mouth daily.     Saw Palmetto, Serenoa repens, 450 MG CAPS Take by mouth.     No current facility-administered medications on file prior to visit.   "

## 2024-07-09 NOTE — Assessment & Plan Note (Signed)
 Blood pressure up slightly in setting of acute vertigo BP: 138/70  Continues to monitor at home   Lisinopril  20 mg  No further hypotension

## 2024-07-09 NOTE — Telephone Encounter (Signed)
 Forwarding to Dr. Randeen who saw patient in office today

## 2024-07-09 NOTE — Telephone Encounter (Signed)
 Copied from CRM [id number]. Topic: Referral - Question >> Jul 09, 2024  4:13 PM Geoffrey Orozco wrote: Reason for CRM: Patient called ENT specialist in regards to his referral. Their first available appt is not until February and he said he cannot wait that long. Please advise. Patient wants to be seen asap.

## 2024-07-09 NOTE — Telephone Encounter (Signed)
 Is there any availability at another appointment earlier ? (Please check with referrals)  I will also cc Joellen Please let me know asap-thanks  He is very symptomatic  If we cannot get him in with ENT-perhaps I will try for vestibular rehab -thanks

## 2024-07-10 NOTE — Telephone Encounter (Signed)
 Working with referral coordinator to get this set up. We have changed office to [facility]. I will follow up this afternoon with office. They have to review referral before scheduling as stat.

## 2024-07-11 NOTE — Telephone Encounter (Signed)
 Called office verified that patient has been scheduled for 1/20 with their [email or web address]

## 2024-08-15 ENCOUNTER — Institutional Professional Consult (permissible substitution) (INDEPENDENT_AMBULATORY_CARE_PROVIDER_SITE_OTHER): Admitting: Physician Assistant
